# Patient Record
Sex: Male | Born: 2000 | Race: Black or African American | Hispanic: No | Marital: Single | Smoking: Never smoker
Health system: Southern US, Community
[De-identification: ages and names within clinical notes are randomized; demographics above are authoritative.]

## PROBLEM LIST (undated history)

## (undated) DIAGNOSIS — S43006A Unspecified dislocation of unspecified shoulder joint, initial encounter: Secondary | ICD-10-CM

## (undated) DIAGNOSIS — K5900 Constipation, unspecified: Secondary | ICD-10-CM

## (undated) DIAGNOSIS — K219 Gastro-esophageal reflux disease without esophagitis: Secondary | ICD-10-CM

## (undated) DIAGNOSIS — Z8489 Family history of other specified conditions: Secondary | ICD-10-CM

## (undated) DIAGNOSIS — J45990 Exercise induced bronchospasm: Secondary | ICD-10-CM

---
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
## 2022-09-29 ENCOUNTER — Ambulatory Visit
Admission: EM | Admit: 2022-09-29 | Discharge: 2022-09-29 | Disposition: A | Discharge status: Home / Self Care | Payer: Medicaid Other | Attending: Internal Medicine | Admitting: Internal Medicine

## 2022-09-29 DIAGNOSIS — L0201 Cutaneous abscess of face: Secondary | ICD-10-CM

## 2022-09-29 MED ORDER — DOXYCYCLINE HYCLATE 100 MG PO CAPS: 100.0000 mg | ORAL_CAPSULE | Freq: Two times a day (BID) | ORAL | 0 refills | Status: AC

## 2022-09-29 NOTE — ED Provider Notes (Signed)
[facility]    CSN: [id number] Arrival date & time: 09/29/22  1012      History   Chief Complaint Chief Complaint  Patient presents with   Abscess    HPI Phillip Morrison is a 21 y.o. male.   Patient presents with concerns of abscess to forehead that started yesterday.  Patient reports that this has occurred before but has never been this large.  He states that it typically resolves on its own with warm compresses.  He has been using warm compresses with no improvement.  Denies any fever, body aches, chills.  Denies any injury to the area.  Denies any purulent drainage.   Abscess   Past Medical History:  Diagnosis Date   Acid reflux    Constipation    Exercise-induced asthma    prn inhaler   Family history of adverse reaction to anesthesia    pt's mother has hx. of post-op N/V   Shoulder dislocation 01/2018   left    There are no problems to display for this patient.   Past Surgical History:  Procedure Laterality Date   SHOULDER ARTHROSCOPY WITH BANKART REPAIR Left 02/06/2018   Procedure: LEFT SHOULDER ARTHROSCOPY WITH LABRAL REPAIR;  Surgeon: Bjorn Pippin, MD;  Location: [facility];  Service: Orthopedics;  Laterality: Left;   SHOULDER ARTHROSCOPY WITH CAPSULORRHAPHY Left 02/06/2018   Procedure: SHOULDER ATHROSCOPY WITH CAPSULORRHAPHY AND GLENOHUMERAL LIGAMENT REPAIR;  Surgeon: Bjorn Pippin, MD;  Location: [facility];  Service: Orthopedics;  Laterality: Left;       Home Medications    Prior to Admission medications   Medication Sig Start Date End Date Taking? Authorizing Provider  doxycycline (VIBRAMYCIN) 100 MG capsule Take 1 capsule (100 mg total) by mouth 2 (two) times daily. 09/29/22  Yes Eddison Searls, Acie Fredrickson, FNP  albuterol (PROVENTIL HFA;VENTOLIN HFA) 108 (90 Base) MCG/ACT inhaler Inhale 1-2 puffs into the lungs every 6 (six) hours as needed for wheezing or shortness of breath. 01/22/16   Lorre Nick, MD  omeprazole  (PRILOSEC) 10 MG capsule Take 10 mg by mouth as needed.    [provider]  polyethylene glycol powder (MIRALAX) powder Use as directed per handout provided 10/11/14   Lowanda Foster, NP    Family History Family History  Problem Relation Age of Onset   Anesthesia problems Mother        post-op N/V   Hypertension Mother    Asthma Mother    Asthma Sister     Social History Social History   Tobacco Use   Smoking status: Never   Smokeless tobacco: Never  Vaping Use   Vaping Use: Never used  Substance Use Topics   Alcohol use: No   Drug use: No     Allergies   Lactose intolerance (gi)   Review of Systems Review of Systems Per HPI  Physical Exam Triage Vital Signs ED Triage Vitals [09/29/22 1035]  Enc Vitals Group     BP 114/70     Pulse Rate (!) 53     Resp 16     Temp 98.1 F (36.7 C)     Temp Source Oral     SpO2 97 %     Weight      Height      Head Circumference      Peak Flow      Pain Score 0     Pain Loc      Pain Edu?  Excl. in [place]?    No data found.  Updated Vital Signs BP 114/70 (BP Location: Left Arm)   Pulse (!) 53   Temp 98.1 F (36.7 C) (Oral)   Resp 16   SpO2 97%   Visual Acuity Right Eye Distance:   Left Eye Distance:   Bilateral Distance:    Right Eye Near:   Left Eye Near:    Bilateral Near:     Physical Exam Constitutional:      General: He is not in acute distress.    Appearance: Normal appearance. He is not toxic-appearing or diaphoretic.  HENT:     Head: Normocephalic and atraumatic.      Comments: Approximately 2.5 to 3 cm indurated abscess present between eyebrows.  No drainage noted.  There is an erythematous area present to right lower portion that may be consistent with pimple-like lesion. Eyes:     Extraocular Movements: Extraocular movements intact.     Conjunctiva/sclera: Conjunctivae normal.  Pulmonary:     Effort: Pulmonary effort is normal.  Neurological:     General: No focal deficit  present.     Mental Status: He is alert and oriented to person, place, and time. Mental status is at baseline.  Psychiatric:        Mood and Affect: Mood normal.        Behavior: Behavior normal.        Thought Content: Thought content normal.        Judgment: Judgment normal.      UC Treatments / Results  Labs (all labs ordered are listed, but only abnormal results are displayed) Labs Reviewed - No data to display  EKG   Radiology No results found.  Procedures Procedures (including critical care time)  Medications Ordered in UC Medications - No data to display  Initial Impression / Assessment and Plan / UC Course  I have reviewed the triage vital signs and the nursing notes.  Pertinent labs & imaging results that were available during my care of the patient were reviewed by me and considered in my medical decision making (see chart for details).     Patient has abscess-like lesion present to forehead.  It appears that it may have started out as a acne pimple that has progressed into an abscess.  Advised warm compresses and will treat with doxycycline antibiotic.  Advised patient to take this medication with food.  Patient was advised to follow-up at the emergency department if no improvement in the next 48 hours and voiced understanding.  Patient verbalized understanding and was agreeable with plan. Final Clinical Impressions(s) / UC Diagnoses   Final diagnoses:  Facial abscess     Discharge Instructions      You have an abscess of your face which is being treated with an antibiotic.  Please continue warm compresses.  If no improvement in the next 48 hours, please go to the emergency department.    ED Prescriptions     Medication Sig Dispense Auth. Provider   doxycycline (VIBRAMYCIN) 100 MG capsule Take 1 capsule (100 mg total) by mouth 2 (two) times daily. 20 capsule Teodora Medici, [place]      PDMP not reviewed this encounter.   Teodora Medici,  09/29/22  [phone]

## 2022-09-29 NOTE — Discharge Instructions (Signed)
You have an abscess of your face which is being treated with an antibiotic.  Please continue warm compresses.  If no improvement in the next 48 hours, please go to the emergency department.

## 2022-09-29 NOTE — ED Triage Notes (Signed)
Pt c/o possible abscess to forehead onset ~ yesterday. Associated dryness throughout face, and scalp.

## 2022-11-21 ENCOUNTER — Ambulatory Visit: Admit: 2022-11-21 | Discharge status: Absent without leave | Payer: Medicaid Other

## 2023-08-06 ENCOUNTER — Encounter: Payer: Self-pay | Admitting: Family Medicine

## 2023-08-06 ENCOUNTER — Ambulatory Visit (INDEPENDENT_AMBULATORY_CARE_PROVIDER_SITE_OTHER): Payer: Medicaid Other | Admitting: Family Medicine

## 2023-08-06 VITALS — BP 128/74 | HR 52 | Temp 98.2°F | Resp 16 | Ht 73.5 in | Wt 186.4 lb

## 2023-08-06 DIAGNOSIS — L700 Acne vulgaris: Secondary | ICD-10-CM | POA: Diagnosis not present

## 2023-08-06 DIAGNOSIS — Z7689 Persons encountering health services in other specified circumstances: Secondary | ICD-10-CM

## 2023-08-06 MED ORDER — MINOCYCLINE HCL 100 MG PO CAPS: 100.0000 mg | ORAL_CAPSULE | Freq: Every day | ORAL | 2 refills | Status: AC

## 2023-08-06 NOTE — Progress Notes (Unsigned)
Establish care

## 2023-08-07 ENCOUNTER — Encounter: Payer: Self-pay | Admitting: Family Medicine

## 2023-08-07 NOTE — Progress Notes (Signed)
New Patient Office Visit  Subjective    Patient ID: Phillip Morrison, male    DOB: 01/26/2001  Age: 22 y.o. MRN: [id number]  CC:  Chief Complaint  Patient presents with   Establish Care    HPI Phillip Morrison presents to establish care and for complaint of acne. He reports that he has been utilizing OTC benzoyl peroxide with minimal improvement.    Outpatient Encounter Medications as of 08/06/2023  Medication Sig   albuterol (PROVENTIL HFA;VENTOLIN HFA) 108 (90 Base) MCG/ACT inhaler Inhale 1-2 puffs into the lungs every 6 (six) hours as needed for wheezing or shortness of breath.   doxycycline (VIBRAMYCIN) 100 MG capsule Take 1 capsule (100 mg total) by mouth 2 (two) times daily.   minocycline (MINOCIN) 100 MG capsule Take 1 capsule (100 mg total) by mouth daily.   omeprazole (PRILOSEC) 10 MG capsule Take 10 mg by mouth as needed.   polyethylene glycol powder (MIRALAX) powder Use as directed per handout provided   No facility-administered encounter medications on file as of 08/06/2023.    Past Medical History:  Diagnosis Date   Acid reflux    Constipation    Exercise-induced asthma    prn inhaler   Family history of adverse reaction to anesthesia    pt's mother has hx. of post-op N/V   Shoulder dislocation 01/2018   left    Past Surgical History:  Procedure Laterality Date   SHOULDER ARTHROSCOPY WITH BANKART REPAIR Left 02/06/2018   Procedure: LEFT SHOULDER ARTHROSCOPY WITH LABRAL REPAIR;  Surgeon: Bjorn Pippin, MD;  Location: [facility];  Service: Orthopedics;  Laterality: Left;   SHOULDER ARTHROSCOPY WITH CAPSULORRHAPHY Left 02/06/2018   Procedure: SHOULDER ATHROSCOPY WITH CAPSULORRHAPHY AND GLENOHUMERAL LIGAMENT REPAIR;  Surgeon: Bjorn Pippin, MD;  Location: [facility];  Service: Orthopedics;  Laterality: Left;    Family History  Problem Relation Age of Onset   Anesthesia problems Mother        post-op N/V   Hypertension Mother    Asthma  Mother    Asthma Sister     Social History   Socioeconomic History   Marital status: Single    Spouse name: Not on file   Number of children: Not on file   Years of education: Not on file   Highest education level: Not on file  Occupational History   Not on file  Tobacco Use   Smoking status: Never   Smokeless tobacco: Never  Vaping Use   Vaping status: Never Used  Substance and Sexual Activity   Alcohol use: No   Drug use: No   Sexual activity: Not on file  Other Topics Concern   Not on file  Social History Narrative   Not on file   Social Determinants of Health   Financial Resource Strain: Low Risk  (08/06/2023)   Overall Financial Resource Strain (CARDIA)    Difficulty of Paying Living Expenses: Not hard at all  Food Insecurity: No Food Insecurity (08/06/2023)   Hunger Vital Sign    Worried About Running Out of Food in the Last Year: Never true    Ran Out of Food in the Last Year: Never true  Transportation Needs: No Transportation Needs (08/06/2023)   PRAPARE - Administrator, Civil Service (Medical): No    Lack of Transportation (Non-Medical): No  Physical Activity: Sufficiently Active (08/06/2023)   Exercise Vital Sign    Days of Exercise per Week: 7 days  Minutes of Exercise per Session: 30 min  Stress: No Stress Concern Present (08/06/2023)   [employer] of Occupational Health - Occupational Stress Questionnaire    Feeling of Stress : Not at all  Social Connections: Moderately Integrated (08/06/2023)   Social Connection and Isolation Panel [NHANES]    Frequency of Communication with Friends and Family: More than three times a week    Frequency of Social Gatherings with Friends and Family: More than three times a week    Attends Religious Services: More than 4 times per year    Active Member of [employer] or Organizations: Yes    Attends Banker Meetings: More than 4 times per year    Marital Status: Never married  Intimate Partner  Violence: Not At Risk (08/06/2023)   Humiliation, Afraid, Rape, and Kick questionnaire    Fear of Current or Ex-Partner: No    Emotionally Abused: No    Physically Abused: No    Sexually Abused: No    Review of Systems  All other systems reviewed and are negative.       Objective    BP 128/74 (BP Location: Right Arm, Patient Position: Sitting, Cuff Size: Large)   Pulse (!) 52   Temp 98.2 F (36.8 C) (Oral)   Resp 16   Ht 6' 1.5" (1.867 m)   Wt 186 lb 6.4 oz (84.6 kg)   SpO2 96%   BMI 24.26 kg/m   Physical Exam Vitals and nursing note reviewed.  Constitutional:      General: He is not in acute distress. Cardiovascular:     Rate and Rhythm: Normal rate and regular rhythm.  Pulmonary:     Effort: Pulmonary effort is normal.     Breath sounds: Normal breath sounds.  Skin:    Findings: Acne (with postinflammatory hyperpigmentation noted) present.  Neurological:     General: No focal deficit present.     Mental Status: He is alert and oriented to person, place, and time.         Assessment & Plan:   Acne vulgaris  Encounter to establish care  Other orders -     Minocycline HCl; Take 1 capsule (100 mg total) by mouth daily.  Dispense: 30 capsule; Refill: 2     Return in about 3 months (around 11/05/2023) for follow up.   Tommie Raymond, MD

## 2023-08-22 ENCOUNTER — Ambulatory Visit: Payer: Self-pay | Admitting: *Deleted

## 2023-08-22 NOTE — Telephone Encounter (Signed)
Tried to reach pt; left vm to call office back to schedule appt. Next appt available 10/22

## 2023-08-22 NOTE — Telephone Encounter (Signed)
Reason for Disposition  [1] MODERATE pain (e.g., interferes with normal activities) AND [2] pain comes and goes (cramps) AND [3] present > 24 hours  (Exception: Pain with Vomiting or Diarrhea - see that Guideline.)    C/o left lower abd pain  Answer Assessment - Initial Assessment Questions 1. LOCATION: "Where does it hurt?"      2 days ago I started hurting in lower abd on left.   No diarrhea or vomiting  2. RADIATION: "Does the pain shoot anywhere else?" (e.g., chest, back)     No 3. ONSET: "When did the pain begin?" (Minutes, hours or days ago)      2 days ago 4. SUDDEN: "Gradual or sudden onset?"     Not asked 5. PATTERN "Does the pain come and go, or is it constant?"    - If it comes and goes: "How long does it last?" "Do you have pain now?"     (Note: Comes and goes means the pain is intermittent. It goes away completely between bouts.)    - If constant: "Is it getting better, staying the same, or getting worse?"      (Note: Constant means the pain never goes away completely; most serious pain is constant and gets worse.)      He is on an antibiotic for his skin.   I was on it for a couple of days.   6. SEVERITY: "How bad is the pain?"  (e.g., Scale 1-10; mild, moderate, or severe)    - MILD (1-3): Doesn't interfere with normal activities, abdomen soft and not tender to touch.     - MODERATE (4-7): Interferes with normal activities or awakens from sleep, abdomen tender to touch.     - SEVERE (8-10): Excruciating pain, doubled over, unable to do any normal activities.       6/10 7. RECURRENT SYMPTOM: "Have you ever had this type of stomach pain before?" If Yes, ask: "When was the last time?" and "What happened that time?"      No 8. CAUSE: "What do you think is causing the stomach pain?"     I don't know 9. RELIEVING/AGGRAVATING FACTORS: "What makes it better or worse?" (e.g., antacids, bending or twisting motion, bowel movement)     nothing 10. OTHER SYMPTOMS: "Do you have any  other symptoms?" (e.g., back pain, diarrhea, fever, urination pain, vomiting)       I have nausea.   He feels tired.   He wants to sleep.  No coughing  Protocols used: Abdominal Pain - Male-A-AH

## 2023-08-22 NOTE — Telephone Encounter (Addendum)
  Chief Complaint: Left lower abd pain. Symptoms: Having nausea and left lower abd pain.   Started taking Minocycline 100 mg for acne on 08/06/2023 when he established care with Dr. Andrey Campanile. Frequency: For last 2 days. Pertinent Negatives: Patient denies diarrhea, vomiting or other symptoms.   No fever, runny nose, coughing, sore throat, no diarrhea or vomiting. Disposition: [] ED /[x] Urgent Care (no appt availability in office) / [] Appointment(In office/virtual)/ [x]  [facility]/ [] Home Care/ [] Refused Recommended Disposition /[] [facility]/ [x]  Follow-up with PCP Additional Notes: Mother Noralee Chars and pt on phone call.   There are no appts available with either provider at [facility] until Oct. 23, 2024 at the earliest with Ricky Stabs, NP.    They just established care on 08/06/2023 with Dr. Andrey Campanile.   I offered him a [facility] virtual visit and urgent care as options.   [facility] is in out of town today so did not offer that option.   I let them know I would send a message and see if he can be worked in.   They were agreeable to this plan.     I have added him to the wait list in case of a cancellation.    Message sent to [facility] at [facility].

## 2023-11-05 ENCOUNTER — Ambulatory Visit: Payer: Medicaid Other | Admitting: Family Medicine

## 2023-11-07 ENCOUNTER — Ambulatory Visit: Payer: Medicaid Other | Admitting: Family Medicine

## 2024-06-04 ENCOUNTER — Ambulatory Visit: Payer: Self-pay

## 2024-06-04 NOTE — Telephone Encounter (Signed)
  FYI Only or Action Required?: FYI only for provider.  Patient was last seen in primary care on 08/06/2023 by Tanda Bleacher, MD.  Called Nurse Triage reporting cyst.  Symptoms began a week ago.  Interventions attempted: Nothing.  Symptoms are: unchanged.  Triage Disposition: See Physician Within 24 Hours  Patient/caregiver understands and will follow disposition?: Yes  Called [facility], no appt availbale, gave information for [facility] and also for UC.      Copied from CRM [id number]. Topic: Clinical - Red Word Triage >> Jun 04, 2024  1:52 PM Jayma L wrote: Red Word that prompted transfer to Nurse Triage: patients mom is calling, Tenna, stated here is a cyst on his eye and one between his eyes. The one between his eye is getting worse and there is some swelling , its hard to touch and asking for a appt today if possible. This has been going on off and on. Started about a week ago , on the left side. Worried about one between eye Reason for Disposition  [1] Swelling is painful to touch AND [2] no fever  Answer Assessment - Initial Assessment Questions 1. APPEARANCE of SWELLING: What does it look like?     swelling 2. SIZE: How large is the swelling? (e.g., inches, cm; or compare to size of pinhead, tip of pen, eraser, coin, pea, grape, ping pong ball)      A quarter, dime 3. LOCATION: Where is the swelling located?     Above right eye and above bridge of nose 4. ONSET: When did the swelling start?     A week ago 5. COLOR: What color is it? Is there more than one color?     red 6. PAIN: Is there any pain? If Yes, ask: How bad is the pain? (Scale 1-10; or mild, moderate, severe)       Only to touch 7. ITCH: Does it itch? If Yes, ask: How bad is the itch?      itching 8. CAUSE: What do you think caused the swelling?     cyst 9 OTHER SYMPTOMS: Do you have any other symptoms? (e.g., fever)     no  Protocols used: Skin Lump or Localized Swelling-A-AH

## 2024-07-27 ENCOUNTER — Encounter: Payer: Self-pay | Admitting: *Deleted

## 2024-07-27 ENCOUNTER — Other Ambulatory Visit: Payer: Self-pay

## 2024-07-27 ENCOUNTER — Ambulatory Visit: Admission: EM | Admit: 2024-07-27 | Discharge: 2024-07-27 | Disposition: A | Discharge status: Home / Self Care

## 2024-07-27 DIAGNOSIS — J069 Acute upper respiratory infection, unspecified: Secondary | ICD-10-CM

## 2024-07-27 LAB — POC SOFIA SARS ANTIGEN FIA: SARS Coronavirus 2 Ag: NEGATIVE

## 2024-07-27 NOTE — ED Provider Notes (Signed)
 [facility]    CSN: [id number] Arrival date & time: 07/27/24  1720      History   Chief Complaint Chief Complaint  Patient presents with   Cough    HPI Asa Baudoin is a 23 y.o. male.   Patient here today for evaluation of cough, headache, congestion that started this morning.  He reports he did vomit after coughing.  He states he has been around a teammate who is also sick with similar symptoms. He has taken robitussin with some relief.  The history is provided by the patient.  Cough Associated symptoms: headaches   Associated symptoms: no chills, no ear pain, no eye discharge, no fever, no shortness of breath and no sore throat     Past Medical History:  Diagnosis Date   Acid reflux    Constipation    Exercise-induced asthma    prn inhaler   Family history of adverse reaction to anesthesia    pt's mother has hx. of post-op N/V   Shoulder dislocation 01/2018   left    There are no active problems to display for this patient.   Past Surgical History:  Procedure Laterality Date   SHOULDER ARTHROSCOPY WITH BANKART REPAIR Left 02/06/2018   Procedure: LEFT SHOULDER ARTHROSCOPY WITH LABRAL REPAIR;  Surgeon: Cristy Bonner DASEN, MD;  Location: [facility];  Service: Orthopedics;  Laterality: Left;   SHOULDER ARTHROSCOPY WITH CAPSULORRHAPHY Left 02/06/2018   Procedure: SHOULDER ATHROSCOPY WITH CAPSULORRHAPHY AND GLENOHUMERAL LIGAMENT REPAIR;  Surgeon: Cristy Bonner DASEN, MD;  [facility];  Service: Orthopedics;  Laterality: Left;       Home Medications    Prior to Admission medications   Medication Sig Start Date End Date Taking? Authorizing Provider  albuterol  (PROVENTIL  HFA;VENTOLIN  HFA) 108 (90 Base) MCG/ACT inhaler Inhale 1-2 puffs into the lungs every 6 (six) hours as needed for wheezing or shortness of breath. 01/22/16  Yes Dasie Faden, MD  meloxicam  (MOBIC ) 15 MG tablet Take 15 mg by mouth daily as needed. 05/26/24  Yes  [provider]  doxycycline  (VIBRAMYCIN ) 100 MG capsule Take 1 capsule (100 mg total) by mouth 2 (two) times daily. Patient not taking: Reported on 07/27/2024 09/29/22   Hazen Darryle BRAVO, FNP  minocycline  (MINOCIN ) 100 MG capsule Take 1 capsule (100 mg total) by mouth daily. Patient not taking: Reported on 07/27/2024 08/06/23   Tanda Bleacher, MD  omeprazole (PRILOSEC) 10 MG capsule Take 10 mg by mouth as needed. Patient not taking: Reported on 07/27/2024    [provider]  polyethylene glycol powder (MIRALAX ) powder Use as directed per handout provided Patient not taking: Reported on 07/27/2024 10/11/14   Eilleen Colander, NP  predniSONE (DELTASONE) 10 MG tablet 10 mg 6 day dose pack   6 day one, 5 pills day 2, 4 pills, day 3, 3 pills day 4, 2 pills day 5, 1 pill day 6 Patient not taking: Reported on 07/27/2024 05/26/24   [provider]    Family History Family History  Problem Relation Age of Onset   Anesthesia problems Mother        post-op N/V   Hypertension Mother    Asthma Mother    Asthma Sister     Social History Social History   Tobacco Use   Smoking status: Never   Smokeless tobacco: Never  Vaping Use   Vaping status: Never Used  Substance Use Topics   Alcohol use: No   Drug use: No  Allergies   Lactose intolerance (gi)   Review of Systems Review of Systems  Constitutional:  Negative for chills and fever.  HENT:  Positive for congestion. Negative for ear pain and sore throat.   Eyes:  Negative for discharge and redness.  Respiratory:  Positive for cough. Negative for shortness of breath.   Gastrointestinal:  Positive for vomiting. Negative for abdominal pain and nausea.  Neurological:  Positive for headaches.     Physical Exam Triage Vital Signs ED Triage Vitals [07/27/24 1728]  Encounter Vitals Group     BP      Girls Systolic BP Percentile      Girls Diastolic BP Percentile      Boys Systolic BP Percentile      Boys Diastolic BP  Percentile      Pulse      Resp      Temp      Temp src      SpO2      Weight      Height      Head Circumference      Peak Flow      Pain Score 3     Pain Loc      Pain Education      Exclude from Growth Chart    No data found.  Updated Vital Signs BP 128/76 (BP Location: Left Arm)   Pulse 62   Temp 98.2 F (36.8 C) (Oral)   Resp 18   SpO2 98%   Visual Acuity Right Eye Distance:   Left Eye Distance:   Bilateral Distance:    Right Eye Near:   Left Eye Near:    Bilateral Near:     Physical Exam Vitals and nursing note reviewed.  Constitutional:      General: He is not in acute distress.    Appearance: Normal appearance. He is not ill-appearing.  HENT:     Head: Normocephalic and atraumatic.     Nose: Congestion present.     Mouth/Throat:     Mouth: Mucous membranes are moist.     Pharynx: Oropharynx is clear. No oropharyngeal exudate or posterior oropharyngeal erythema.  Eyes:     Conjunctiva/sclera: Conjunctivae normal.  Cardiovascular:     Rate and Rhythm: Normal rate and regular rhythm.     Heart sounds: Normal heart sounds. No murmur heard. Pulmonary:     Effort: Pulmonary effort is normal. No respiratory distress.     Breath sounds: Normal breath sounds. No wheezing, rhonchi or rales.  Skin:    General: Skin is warm and dry.  Neurological:     Mental Status: He is alert.  Psychiatric:        Mood and Affect: Mood normal.        Thought Content: Thought content normal.      UC Treatments / Results  Labs (all labs ordered are listed, but only abnormal results are displayed) Labs Reviewed  POC SOFIA SARS ANTIGEN FIA - Normal    EKG   Radiology No results found.  Procedures Procedures (including critical care time)  Medications Ordered in UC Medications - No data to display  Initial Impression / Assessment and Plan / UC Course  I have reviewed the triage vital signs and the nursing notes.  Pertinent labs & imaging results that were  available during my care of the patient were reviewed by me and considered in my medical decision making (see chart for details).    Covid screening negative. Suspect  other viral illness and recommended symptomatic treatment, increased fluids and rest with follow up if no gradual improvement or with any further concerns.   Final Clinical Impressions(s) / UC Diagnoses   Final diagnoses:  Acute upper respiratory infection   Discharge Instructions   None    ED Prescriptions   None    PDMP not reviewed this encounter.   Billy Asberry FALCON, PA-C 07/27/24 1810

## 2024-07-27 NOTE — ED Triage Notes (Signed)
 Pt reports he woke up this morning with cough (emesis after coughing), headache, congestion. He has been around a teammate who is sick

## 2024-07-30 ENCOUNTER — Other Ambulatory Visit: Payer: Self-pay

## 2024-07-30 ENCOUNTER — Encounter (HOSPITAL_COMMUNITY): Payer: Self-pay

## 2024-07-30 ENCOUNTER — Encounter: Payer: Self-pay | Admitting: Emergency Medicine

## 2024-07-30 ENCOUNTER — Ambulatory Visit: Admission: EM | Admit: 2024-07-30 | Discharge: 2024-07-30 | Disposition: A | Discharge status: Home / Self Care

## 2024-07-30 ENCOUNTER — Emergency Department (HOSPITAL_COMMUNITY)
Admission: EM | Admit: 2024-07-30 | Discharge: 2024-07-30 | Disposition: A | Discharge status: Home / Self Care | Attending: Emergency Medicine | Admitting: Emergency Medicine

## 2024-07-30 ENCOUNTER — Emergency Department (HOSPITAL_COMMUNITY)

## 2024-07-30 DIAGNOSIS — R0789 Other chest pain: Secondary | ICD-10-CM | POA: Diagnosis present

## 2024-07-30 DIAGNOSIS — J45909 Unspecified asthma, uncomplicated: Secondary | ICD-10-CM | POA: Insufficient documentation

## 2024-07-30 DIAGNOSIS — R079 Chest pain, unspecified: Secondary | ICD-10-CM | POA: Diagnosis not present

## 2024-07-30 DIAGNOSIS — A084 Viral intestinal infection, unspecified: Secondary | ICD-10-CM | POA: Diagnosis not present

## 2024-07-30 DIAGNOSIS — R059 Cough, unspecified: Secondary | ICD-10-CM | POA: Insufficient documentation

## 2024-07-30 DIAGNOSIS — K2970 Gastritis, unspecified, without bleeding: Secondary | ICD-10-CM

## 2024-07-30 DIAGNOSIS — R112 Nausea with vomiting, unspecified: Secondary | ICD-10-CM

## 2024-07-30 LAB — CBC WITH DIFFERENTIAL/PLATELET
Abs Immature Granulocytes: 0.02 K/uL (ref 0.00–0.07)
Basophils Absolute: 0 K/uL (ref 0.0–0.1)
Basophils Relative: 0 %
Eosinophils Absolute: 0 K/uL (ref 0.0–0.5)
Eosinophils Relative: 0 %
HCT: 46.4 % (ref 39.0–52.0)
Hemoglobin: 14.4 g/dL (ref 13.0–17.0)
Immature Granulocytes: 0 %
Lymphocytes Relative: 31 %
Lymphs Abs: 2.2 K/uL (ref 0.7–4.0)
MCH: 27.1 pg (ref 26.0–34.0)
MCHC: 31 g/dL (ref 30.0–36.0)
MCV: 87.4 fL (ref 80.0–100.0)
Monocytes Absolute: 0.5 K/uL (ref 0.1–1.0)
Monocytes Relative: 7 %
Neutro Abs: 4.3 K/uL (ref 1.7–7.7)
Neutrophils Relative %: 62 %
Platelets: 227 K/uL (ref 150–400)
RBC: 5.31 MIL/uL (ref 4.22–5.81)
RDW: 13.5 % (ref 11.5–15.5)
WBC: 7 K/uL (ref 4.0–10.5)
nRBC: 0 % (ref 0.0–0.2)

## 2024-07-30 LAB — COMPREHENSIVE METABOLIC PANEL WITH GFR
ALT: 14 U/L (ref 0–44)
AST: 18 U/L (ref 15–41)
Albumin: 4.3 g/dL (ref 3.5–5.0)
Alkaline Phosphatase: 83 U/L (ref 38–126)
Anion gap: 12 (ref 5–15)
BUN: 11 mg/dL (ref 6–20)
CO2: 23 mmol/L (ref 22–32)
Calcium: 9.8 mg/dL (ref 8.9–10.3)
Chloride: 104 mmol/L (ref 98–111)
Creatinine, Ser: 1.17 mg/dL (ref 0.61–1.24)
GFR, Estimated: >60 mL/min (ref >60)
Glucose, Bld: 98 mg/dL (ref 70–99)
Potassium: 4.1 mmol/L (ref 3.5–5.1)
Sodium: 139 mmol/L (ref 135–145)
Total Bilirubin: 0.4 mg/dL (ref 0.0–1.2)
Total Protein: 7.2 g/dL (ref 6.5–8.1)

## 2024-07-30 LAB — LIPASE, BLOOD: Lipase: 33 U/L (ref 11–51)

## 2024-07-30 MED ORDER — ONDANSETRON 4 MG PO TBDP
4.0000 mg | ORAL_TABLET | Freq: Once | ORAL | Status: AC
  Administered 2024-07-30: 4 mg via ORAL
  Filled 2024-07-30: qty 1

## 2024-07-30 MED ORDER — PROMETHAZINE HCL 25 MG PO TABS: 25.0000 mg | ORAL_TABLET | Freq: Four times a day (QID) | ORAL | 0 refills | Status: AC | PRN

## 2024-07-30 MED ORDER — KETOROLAC TROMETHAMINE 30 MG/ML IJ SOLN
30.0000 mg | Freq: Once | INTRAMUSCULAR | Status: AC
  Administered 2024-07-30: 30 mg via INTRAMUSCULAR
  Filled 2024-07-30: qty 1

## 2024-07-30 NOTE — ED Provider Notes (Signed)
 [facility] AT [facility] Provider Note   CSN: [id number] Arrival date & time: 07/30/24  1659     Patient presents with: Chest Pain   Phillip Morrison is a 23 y.o. male.   HPI     This is a 23 year old male who presents with nausea, vomiting, chest pain.  Patient reports 3 to 4 days of nausea and vomiting.  He also had some diarrhea to begin with.  No known sick contacts or fevers.  Since that time he has developed some left-sided chest discomfort.  It is mostly with vomiting.  He was seen and evaluated urgent care on 9/1 with COVID testing negative.  Suspected viral illness.  He saw urgent care again today given persistence of symptoms, they recommended he be evaluated.  He has not had any shortness of breath.  Has had an occasional cough.  Took ibuprofen  with some relief.  Prior to Admission medications   Medication Sig Start Date End Date Taking? Authorizing Provider  promethazine  (PHENERGAN ) 25 MG tablet Take 1 tablet (25 mg total) by mouth every 6 (six) hours as needed for nausea or vomiting. 07/30/24  Yes Gizelle Whetsel, Charmaine FALCON, MD  albuterol  (PROVENTIL  HFA;VENTOLIN  HFA) 108 (90 Base) MCG/ACT inhaler Inhale 1-2 puffs into the lungs every 6 (six) hours as needed for wheezing or shortness of breath. 01/22/16   Dasie Faden, MD  doxycycline  (VIBRAMYCIN ) 100 MG capsule Take 1 capsule (100 mg total) by mouth 2 (two) times daily. Patient not taking: Reported on 07/27/2024 09/29/22   Hazen Darryle BRAVO, FNP  ibuprofen  (ADVIL ) 100 MG/5ML suspension Take 5 mg/kg by mouth. Patient not taking: Reported on 07/30/2024 01/31/12   [provider]  meloxicam  (MOBIC ) 15 MG tablet Take 15 mg by mouth daily as needed. 05/26/24   [provider]  minocycline  (MINOCIN ) 100 MG capsule Take 1 capsule (100 mg total) by mouth daily. Patient not taking: Reported on 07/27/2024 08/06/23   Tanda Bleacher, MD  omeprazole (PRILOSEC) 10 MG capsule Take 10 mg by mouth as needed. Patient  not taking: Reported on 07/27/2024    [provider]  ondansetron  (ZOFRAN -ODT) 8 MG disintegrating tablet Take 8 mg by mouth 3 (three) times daily. 07/28/24   [provider]  polyethylene glycol powder (MIRALAX ) powder Use as directed per handout provided Patient not taking: Reported on 07/27/2024 10/11/14   Eilleen Colander, NP  predniSONE (DELTASONE) 10 MG tablet 10 mg 6 day dose pack   6 day one, 5 pills day 2, 4 pills, day 3, 3 pills day 4, 2 pills day 5, 1 pill day 6 Patient not taking: Reported on 07/27/2024 05/26/24   [provider]    Allergies: Lactose intolerance (gi) and Lactulose    Review of Systems  Constitutional:  Negative for fever.  Respiratory:  Positive for cough. Negative for shortness of breath.   Cardiovascular:  Positive for chest pain.  Gastrointestinal:  Positive for nausea and vomiting. Negative for abdominal pain.  All other systems reviewed and are negative.   Updated Vital Signs BP (!) 125/59   Pulse (!) 48   Temp 98.3 F (36.8 C) (Oral)   Resp 17   SpO2 100%   Physical Exam Vitals and nursing note reviewed.  Constitutional:      Appearance: He is well-developed. He is not ill-appearing.  HENT:     Head: Normocephalic and atraumatic.  Eyes:     Pupils: Pupils are equal, round, and reactive to light.  Cardiovascular:  Rate and Rhythm: Normal rate and regular rhythm.     Heart sounds: Normal heart sounds. No murmur heard. Pulmonary:     Effort: Pulmonary effort is normal. No respiratory distress.     Breath sounds: Normal breath sounds. No wheezing.  Chest:     Chest wall: Tenderness present.     Comments: Left chest wall tenderness to palpation without overlying skin changes or crepitus Abdominal:     General: Bowel sounds are normal.     Palpations: Abdomen is soft.     Tenderness: There is abdominal tenderness. There is no rebound.     Comments: Epigastric tenderness to palpation  Musculoskeletal:     Cervical back:  Neck supple.  Lymphadenopathy:     Cervical: No cervical adenopathy.  Skin:    General: Skin is warm and dry.  Neurological:     Mental Status: He is alert and oriented to person, place, and time.  Psychiatric:        Mood and Affect: Mood normal.     (all labs ordered are listed, but only abnormal results are displayed) Labs Reviewed  CBC WITH DIFFERENTIAL/PLATELET  COMPREHENSIVE METABOLIC PANEL WITH GFR  LIPASE, BLOOD    EKG: EKG Interpretation Date/Time:  Thursday July 30 2024 17:08:27 EDT Ventricular Rate:  54 PR Interval:  134 QRS Duration:  84 QT Interval:  389 QTC Calculation: 369 R Axis:   34  Text Interpretation: Sinus rhythm RSR' in V1 or V2, probably normal variant Nonspecific T abnormalities, inferior leads Confirmed by Bari Pfeiffer ([id number]) on 07/30/2024 5:19:53 PM  Radiology: [facility] Chest 2 View Result Date: 07/30/2024 CLINICAL DATA:  Chest pain. EXAM: CHEST - 2 VIEW COMPARISON:  01/22/2016. FINDINGS: The heart size and mediastinal contours are within normal limits. No focal consolidation, pleural effusion, or pneumothorax. The visualized skeletal structures are unremarkable. IMPRESSION: No acute cardiopulmonary findings. Electronically Signed   By: Harrietta Sherry M.D.   On: 07/30/2024 18:16     Procedures   Medications Ordered in the ED  ondansetron  (ZOFRAN -ODT) disintegrating tablet 4 mg (4 mg Oral Given 07/30/24 1804)  ketorolac  (TORADOL ) 30 MG/ML injection 30 mg (30 mg Intramuscular Given 07/30/24 1805)                                    Medical Decision Making Amount and/or Complexity of Data Reviewed Labs: ordered. Radiology: ordered.  Risk Prescription drug management.   This patient presents to the ED for concern of chest pain, vomiting, this involves an extensive number of treatment options, and is a complaint that carries with it a high risk of complications and morbidity.  I considered the following differential and admission for this  acute, potentially life threatening condition.  The differential diagnosis includes viral illness, postviral gastritis, pneumonia, pneumothorax, chest wall pain  MDM:    This is a 23 year old male who presents with persistent nausea and vomiting.  Now has some chest pain.  He is nontoxic and vital signs are reassuring.  He has reproducible chest wall tenderness.  EKG shows no evidence of acute ischemia or arrhythmia.  Chest x-ray without pneumothorax or pneumonia.  May be related to muscle strain from vomiting.  Basic lab work obtained.  No leukocytosis.  No metabolic derangements.  LFTs and lipase are normal.  Suspect viral etiology.  Patient improved with IM Toradol  and Zofran .  Able to p.o. challenge.  States he has Zofran  at  home but it does not work well.  Will discharge with Phenergan .  May have some postviral ongoing gastritis causing nausea and vomiting.  Recommend clear liquid diet.  (Labs, imaging, consults)  Labs: I Ordered, and personally interpreted labs.  The pertinent results include: CBC, CMP, lipase  Imaging Studies ordered: I ordered imaging studies including chest x-ray I independently visualized and interpreted imaging. I agree with the radiologist interpretation  Additional history obtained from family at bedside.  External records from outside source obtained and reviewed including prior evaluations  Cardiac Monitoring: The patient was maintained on a cardiac monitor.  If on the cardiac monitor, I personally viewed and interpreted the cardiac monitored which showed an underlying rhythm of: Sinus  Reevaluation: After the interventions noted above, I reevaluated the patient and found that they have :improved  Social Determinants of Health:  lives independently  Disposition: Discharge  Co morbidities that complicate the patient evaluation  Past Medical History:  Diagnosis Date   Acid reflux    Constipation    Exercise-induced asthma    prn inhaler   Family  history of adverse reaction to anesthesia    pt's mother has hx. of post-op N/V   Shoulder dislocation 01/2018   left     Medicines Meds ordered this encounter  Medications   ondansetron  (ZOFRAN -ODT) disintegrating tablet 4 mg   ketorolac  (TORADOL ) 30 MG/ML injection 30 mg   promethazine  (PHENERGAN ) 25 MG tablet    Sig: Take 1 tablet (25 mg total) by mouth every 6 (six) hours as needed for nausea or vomiting.    Dispense:  30 tablet    Refill:  0    I have reviewed the patients home medicines and have made adjustments as needed  Problem List / ED Course: Problem List Items Addressed This Visit   None Visit Diagnoses       Atypical chest pain    -  Primary     Nausea and vomiting, unspecified vomiting type         Viral gastritis                    Final diagnoses:  Atypical chest pain  Nausea and vomiting, unspecified vomiting type  Viral gastritis    ED Discharge Orders          Ordered    promethazine  (PHENERGAN ) 25 MG tablet  Every 6 hours PRN        07/30/24 2027               Bari Charmaine FALCON, MD 07/30/24 2031

## 2024-07-30 NOTE — ED Provider Notes (Signed)
 [facility]    CSN: [id number] Arrival date & time: 07/30/24  1555      History   Chief Complaint Chief Complaint  Patient presents with   Chest Pain   Emesis    HPI Phillip Morrison is a 23 y.o. male.   Patient here today for evaluation of left-sided sharp chest pain.  He reports that this started after he had a 4-day illness with vomiting after he ate anything.  He denies any shortness of breath or palpitations.  He states chest pain does seem to be worse after vomiting.  He has not had any cough or congestion.  He is currently nauseated.  He has taken an antiemetic without improvement.  He does not report fever.  The history is provided by the patient.  Chest Pain Associated symptoms: nausea and vomiting   Associated symptoms: no fever and no shortness of breath   Emesis Associated symptoms: no fever     Past Medical History:  Diagnosis Date   Acid reflux    Constipation    Exercise-induced asthma    prn inhaler   Family history of adverse reaction to anesthesia    pt's mother has hx. of post-op N/V   Shoulder dislocation 01/2018   left    Patient Active Problem List   Diagnosis Date Noted   Slow transit constipation 08/22/2015   Lactose intolerance 04/23/2012   Abdominal pain 01/31/2012    Past Surgical History:  Procedure Laterality Date   SHOULDER ARTHROSCOPY WITH BANKART REPAIR Left 02/06/2018   Procedure: LEFT SHOULDER ARTHROSCOPY WITH LABRAL REPAIR;  Surgeon: Cristy Bonner DASEN, MD;  Location: [facility];  Service: Orthopedics;  Laterality: Left;   SHOULDER ARTHROSCOPY WITH CAPSULORRHAPHY Left 02/06/2018   Procedure: SHOULDER ATHROSCOPY WITH CAPSULORRHAPHY AND GLENOHUMERAL LIGAMENT REPAIR;  Surgeon: Cristy Bonner DASEN, MD;  Location: [facility];  Service: Orthopedics;  Laterality: Left;       Home Medications    Prior to Admission medications   Medication Sig Start Date End Date Taking? Authorizing Provider  meloxicam   (MOBIC ) 15 MG tablet Take 15 mg by mouth daily as needed. 05/26/24  Yes [provider]  ondansetron  (ZOFRAN -ODT) 8 MG disintegrating tablet Take 8 mg by mouth 3 (three) times daily. 07/28/24  Yes [provider]  albuterol  (PROVENTIL  HFA;VENTOLIN  HFA) 108 (90 Base) MCG/ACT inhaler Inhale 1-2 puffs into the lungs every 6 (six) hours as needed for wheezing or shortness of breath. 01/22/16   Dasie Faden, MD  doxycycline  (VIBRAMYCIN ) 100 MG capsule Take 1 capsule (100 mg total) by mouth 2 (two) times daily. Patient not taking: Reported on 07/27/2024 09/29/22   Hazen Darryle BRAVO, FNP  ibuprofen  (ADVIL ) 100 MG/5ML suspension Take 5 mg/kg by mouth. Patient not taking: Reported on 07/30/2024 01/31/12   [provider]  minocycline  (MINOCIN ) 100 MG capsule Take 1 capsule (100 mg total) by mouth daily. Patient not taking: Reported on 07/27/2024 08/06/23   Tanda Bleacher, MD  omeprazole (PRILOSEC) 10 MG capsule Take 10 mg by mouth as needed. Patient not taking: Reported on 07/27/2024    [provider]  polyethylene glycol powder (MIRALAX ) powder Use as directed per handout provided Patient not taking: Reported on 07/27/2024 10/11/14   Eilleen Colander, NP  predniSONE (DELTASONE) 10 MG tablet 10 mg 6 day dose pack   6 day one, 5 pills day 2, 4 pills, day 3, 3 pills day 4, 2 pills day 5, 1 pill day 6 Patient not  taking: Reported on 07/27/2024 05/26/24   [provider]    Family History Family History  Problem Relation Age of Onset   Anesthesia problems Mother        post-op N/V   Hypertension Mother    Asthma Mother    Asthma Sister     Social History Social History   Tobacco Use   Smoking status: Never    Passive exposure: Never   Smokeless tobacco: Never  Vaping Use   Vaping status: Never Used  Substance Use Topics   Alcohol use: No   Drug use: No     Allergies   Lactose intolerance (gi) and Lactulose   Review of Systems Review of Systems  Constitutional:   Negative for fever.  Eyes:  Negative for discharge and redness.  Respiratory:  Negative for shortness of breath.   Cardiovascular:  Positive for chest pain.  Gastrointestinal:  Positive for nausea and vomiting.     Physical Exam Triage Vital Signs ED Triage Vitals  Encounter Vitals Group     BP 07/30/24 1609 116/74     Girls Systolic BP Percentile --      Girls Diastolic BP Percentile --      Boys Systolic BP Percentile --      Boys Diastolic BP Percentile --      Pulse Rate 07/30/24 1609 61     Resp 07/30/24 1609 16     Temp 07/30/24 1609 98.7 F (37.1 C)     Temp Source 07/30/24 1609 Oral     SpO2 07/30/24 1609 95 %     Weight --      Height --      Head Circumference --      Peak Flow --      Pain Score 07/30/24 1610 3     Pain Loc --      Pain Education --      Exclude from Growth Chart --    No data found.  Updated Vital Signs BP 116/74 (BP Location: Left Arm)   Pulse 61   Temp 98.7 F (37.1 C) (Oral)   Resp 16   SpO2 95%   Visual Acuity Right Eye Distance:   Left Eye Distance:   Bilateral Distance:    Right Eye Near:   Left Eye Near:    Bilateral Near:     Physical Exam Vitals and nursing note reviewed.  Constitutional:      Appearance: He is well-developed.     Comments: Appears to not feel well  HENT:     Head: Normocephalic and atraumatic.     Nose: Nose normal. No congestion.  Eyes:     Conjunctiva/sclera: Conjunctivae normal.  Cardiovascular:     Rate and Rhythm: Normal rate and regular rhythm.  Pulmonary:     Effort: Pulmonary effort is normal. No respiratory distress.     Breath sounds: No wheezing, rhonchi or rales.  Neurological:     Mental Status: He is alert.      UC Treatments / Results  Labs (all labs ordered are listed, but only abnormal results are displayed) Labs Reviewed - No data to display  EKG   Radiology No results found.  Procedures Procedures (including critical care time)  Medications Ordered in  UC Medications - No data to display  Initial Impression / Assessment and Plan / UC Course  I have reviewed the triage vital signs and the nursing notes.  Pertinent labs & imaging results that were available  during my care of the patient were reviewed by me and considered in my medical decision making (see chart for details).    Recommended further evaluation in the emergency room for stat labs and further workup given duration of vomiting and chest pain.  Patient is agreeable to same and grandmother will transport via POV.  Final Clinical Impressions(s) / UC Diagnoses   Final diagnoses:  Chest pain, unspecified type   Discharge Instructions   None    ED Prescriptions   None    PDMP not reviewed this encounter.   Billy Asberry FALCON, PA-C 07/30/24 1725

## 2024-07-30 NOTE — ED Triage Notes (Signed)
 Pt states he is having chest pain that started on Tuesday, he states the pain goes into his left arm. He did have a cough and some vomiting last week. Pt states he has pain when he moves his left arm and takes a deep breath.

## 2024-07-30 NOTE — Discharge Instructions (Addendum)
 You were seen today for chest pain.  This is likely muscular in nature.  May be related to recent vomiting.  Maintain a clear liquid diet.  Take nausea medication for ongoing vomiting.  Your labs are reassuring.  This is most likely viral in nature.

## 2024-07-30 NOTE — ED Notes (Signed)
 Patient is being discharged from the Urgent Care and sent to the Emergency Department via POV . Per Billy, PA-C, patient is in need of higher level of care due to need for further evaluation of L-sided chest pain. Patient is aware and verbalizes understanding of plan of care.  Vitals:   07/30/24 1609  BP: 116/74  Pulse: 61  Resp: 16  Temp: 98.7 F (37.1 C)  SpO2: 95%

## 2024-07-30 NOTE — ED Triage Notes (Signed)
 Pt reports continued vomiting x4 days. Notes he vomits right after any meals or fluids. Feels weak. Started have intermittent L-sided sharp chest pain after vomiting episodes x4 days. Denies SOB or palpitations. Seen at [facility] 9/1 with emesis, cough, and congestion. Denies cough and congestion now. Is currently nauseous. States he has taken antiemetic with no relief.

## 2024-10-19 ENCOUNTER — Other Ambulatory Visit: Payer: Self-pay

## 2024-10-19 ENCOUNTER — Ambulatory Visit: Attending: Family Medicine

## 2024-10-19 DIAGNOSIS — M25552 Pain in left hip: Secondary | ICD-10-CM | POA: Diagnosis present

## 2024-10-19 DIAGNOSIS — S76302D Unspecified injury of muscle, fascia and tendon of the posterior muscle group at thigh level, left thigh, subsequent encounter: Secondary | ICD-10-CM | POA: Diagnosis present

## 2024-10-19 NOTE — Therapy (Signed)
 OUTPATIENT PHYSICAL THERAPY LOWER EXTREMITY EVALUATION   Patient Name: Phillip Morrison MRN: [id number] DOB:02/12/2001, 23 y.o., male Today's Date: 10/19/2024  END OF SESSION:  PT End of Session - 10/19/24 0902     Visit Number 1    Number of Visits 16    Date for Recertification  12/14/24    Authorization Type [employer]    Authorization Time Period none until completed 27th visit    PT Start Time 0915    PT Stop Time 1000    PT Time Calculation (min) 45 min    Activity Tolerance Patient tolerated treatment well    Behavior During Therapy [facility] for tasks assessed/performed          Past Medical History:  Diagnosis Date   Acid reflux    Constipation    Exercise-induced asthma    prn inhaler   Family history of adverse reaction to anesthesia    pt's mother has hx. of post-op N/V   Shoulder dislocation 01/2018   left   Past Surgical History:  Procedure Laterality Date   SHOULDER ARTHROSCOPY WITH BANKART REPAIR Left 02/06/2018   Procedure: LEFT SHOULDER ARTHROSCOPY WITH LABRAL REPAIR;  Surgeon: Cristy Bonner DASEN, MD;  Location: [facility];  Service: Orthopedics;  Laterality: Left;   SHOULDER ARTHROSCOPY WITH CAPSULORRHAPHY Left 02/06/2018   Procedure: SHOULDER ATHROSCOPY WITH CAPSULORRHAPHY AND GLENOHUMERAL LIGAMENT REPAIR;  Surgeon: Cristy Bonner DASEN, MD;  Location: [facility];  Service: Orthopedics;  Laterality: Left;   Patient Active Problem List   Diagnosis Date Noted   Slow transit constipation 08/22/2015   Lactose intolerance 04/23/2012   Abdominal pain 01/31/2012    PCP: Tanda Bleacher, MD  REFERRING PROVIDER: Curtis Hadassah DASEN, MD  REFERRING DIAG: [phone] (ICD-10-CM) - Pain in left hip   THERAPY DIAG:  Pain in left hip  Rationale for Evaluation and Treatment: Rehabilitation  ONSET DATE: 09/16/2024  SUBJECTIVE:   SUBJECTIVE STATEMENT: Pt reports he strained his L hamstring while cutting in football practice on 09/16/2024. He  notes hearing a pop after pushing off the L leg. Pt fell and had trouble getting up. He went to the doctor after who performed an X-ray and MRI which were negative for a tear and fracture. Pt reports the MRI showed swelling around the upper portion of the hamstring. Notes trouble walking for a few days. He has been performing some exercises given to him by the AT but has not practiced. Pt did try to squat just the bar last week but there was too much pain. He was squatting 345lb for his 1RM. Pt expresses improvement in symptoms since onset but continues to have pain when standing, marching, going up stairs, running, and squatting.    PERTINENT HISTORY: L ankle fracture, previous L hamstring strains, low back pain, previous MCL injury (unsure on side) PAIN:  Are you having pain? Yes: NPRS scale: 3/10;  Pain location: proximal L hamstring Pain description: ache Aggravating factors: marching, squatting, stairs (going up), standing greater than 1 hour Relieving factors: rest  PRECAUTIONS: None  RED FLAGS: None   WEIGHT BEARING RESTRICTIONS: No  FALLS:  Has patient fallen in last 6 months? No  LIVING ENVIRONMENT: Lives with: lives with their family Lives in: House/apartment Stairs: Yes: Internal: 10 steps; did not ask Has following equipment at home: None  OCCUPATION: student  PLOF: Independent  PATIENT GOALS: return to sport   NEXT MD VISIT: tbd  OBJECTIVE:  Note: Objective measures were completed at Evaluation unless  otherwise noted.  DIAGNOSTIC FINDINGS:  See subjective   PATIENT SURVEYS:  LEFS  Extreme difficulty/unable (0), Quite a bit of difficulty (1), Moderate difficulty (2), Little difficulty (3), No difficulty (4) Survey date:  10/19/2024  Any of your usual work, housework or school activities 3  2. Usual hobbies, recreational or sporting activities 0  3. Getting into/out of the bath 3  4. Walking between rooms 4  5. Putting on socks/shoes 4  6. Squatting  1   7. Lifting an object, like a bag of groceries from the floor 1  8. Performing light activities around your home 4  9. Performing heavy activities around your home 2  10. Getting into/out of a car 4  11. Walking 2 blocks 2  12. Walking 1 mile 1  13. Going up/down 10 stairs (1 flight) 3  14. Standing for 1 hour 2  15.  sitting for 1 hour 4  16. Running on even ground 0  17. Running on uneven ground 0  18. Making sharp turns while running fast 0  19. Hopping  2  20. Rolling over in bed 4  Score total:  44/80     COGNITION: Overall cognitive status: Within functional limits for tasks assessed     SENSATION: WFL  MUSCLE LENGTH: Hamstrings: deferred Thomas test: deferred  POSTURE: anterior pelvic tilt  PALPATION: Tenderness over proximal hamstring on L   LOWER EXTREMITY ROM:  Active ROM Right eval Left eval  Hip flexion 110 90*   Hip extension 10 5*  Hip abduction 40 25*  Hip adduction    Hip internal rotation WNL WNL*  Hip external rotation WNL WNL  Knee flexion    Knee extension 0 0  Ankle dorsiflexion 5 3  Ankle plantarflexion 50 40  Ankle inversion WNL WNL  Ankle eversion WNL WNL*   (Blank rows = not tested)(* = pain)   LOWER EXTREMITY MMT:  MMT Right eval Left eval  Hip flexion    Hip extension 5/5 3+/5*  Hip abduction 5/5 3+/5*  Hip adduction    Hip internal rotation    Hip external rotation    Knee flexion 5/5 4/5*  Knee extension    Ankle dorsiflexion 5/5 3+/5  Ankle plantarflexion    Ankle inversion 5/5 5/5  Ankle eversion 5/5 5/5   (Blank rows = not tested)(* = pain)   FUNCTIONAL TESTS:  SLS - able on R; mod sway on L  Squat bodyweight - ankle pronation, knee valgus, hip IR, cannot get below 90; pain; all on L side Squat with 45lb bar - ankle pronation, knee valgus, hip IR, cannot get below 90; pain; all on L side                                                                                                                                 TREATMENT DATE:  [facility]:  DATE: 10/14/2024    Therapeutic Exercise:  Squat bodyweight x 3 - form check + education  Squat with 45lb bar x 3 - form check + education   PATIENT EDUCATION:  Education details: POC, HEP, diagnosis, prognosis Person educated: Patient and Parent Education method: Explanation, Demonstration, Tactile cues, Verbal cues, and Handouts Education comprehension: verbalized understanding, returned demonstration, verbal cues required, tactile cues required, and needs further education  HOME EXERCISE PROGRAM: Access Code: [email or web address] URL: [email or web address] Date: 10/19/2024 Prepared by: Marijo Berber  Exercises - Ankle Dorsiflexion with Resistance  - 1 x daily - 7 x weekly - 3 sets - 10 reps - Ankle Inversion with Resistance  - 1 x daily - 7 x weekly - 3 sets - 10 reps - Ankle Eversion with Resistance  - 1 x daily - 7 x weekly - 3 sets - 10 reps - Side Stepping with Resistance at Feet  - 1 x daily - 7 x weekly - 3 sets - 10 reps - Side Plank with Clam and Resistance  - 1 x daily - 7 x weekly - 3 sets - 10 reps - Seated Hamstring Curl with Anchored Resistance  - 1 x daily - 7 x weekly - 3 sets - 10 reps  ASSESSMENT:  CLINICAL IMPRESSION: Patient is a 23 y.o. male who was seen today for physical therapy evaluation and treatment for L proximal hamstring strain. Pt presents with pain during hip extension, hip ABD, knee flexion, and hip IR. He is weak throughout the hip/knee/ankle with functional weakness seen in squatting and SLS. The pt has previous injuries on the L LE that is impacting his hip mobility and strength. The pt will benefit from skilled physical therapy to return decrease pain and increase function.    OBJECTIVE IMPAIRMENTS: decreased activity tolerance, decreased balance, decreased coordination, decreased mobility, decreased ROM, decreased strength, hypomobility, impaired  flexibility, improper body mechanics, and pain.   ACTIVITY LIMITATIONS: lifting, bending, standing, squatting, and stairs  PARTICIPATION LIMITATIONS: community activity and school  PERSONAL FACTORS: 1-2 comorbidities: previous R ankle fracture and chronic hamstring strains are also affecting patient's functional outcome.   REHAB POTENTIAL: Good  CLINICAL DECISION MAKING: Evolving/moderate complexity  EVALUATION COMPLEXITY: Moderate   GOALS: Goals reviewed with patient? Yes  SHORT TERM GOALS: Target date: 11/16/2024 Pt will be compliant and independent with HEP to assist with symptom management/recovery at home.  Baseline: [email or web address] Goal status: INITIAL  2.  Pt will demonstrate equal AROM B to assist with recreational activities.  Baseline:  Active ROM Right eval Left eval  Hip flexion 110 90*   Hip extension 10 5*  Hip abduction 40 25*  Hip adduction    Hip internal rotation WNL WNL*  Hip external rotation WNL WNL  Knee flexion    Knee extension 0 0  Ankle dorsiflexion 5 3  Ankle plantarflexion 50 40  Ankle inversion WNL WNL  Ankle eversion WNL WNL*   Goal status: INITIAL  3.  Pt will be able to run for 20 minutes without pain following.  Baseline: pain regardless of distance/length of time   Goal status: INITIAL  4.  Pt squat form will show no sign of L knee valgus or hip IR, bodyweight or loaded.  Baseline: L knee valgus and hip IR  Goal status: INITIAL  5.  Pt will report no pain at rest.  Baseline: 3/10  Goal status: INITIAL   LONG TERM GOALS: Target date: 12/14/2024  Pt will demonstrate equal strength B to assist with  recreational activities.  Baseline:  MMT Right eval Left eval  Hip flexion    Hip extension 5/5 3+/5*  Hip abduction 5/5 3+/5*  Hip adduction    Hip internal rotation    Hip external rotation    Knee flexion 5/5 4/5*  Knee extension    Ankle dorsiflexion 5/5 3+/5  Ankle plantarflexion    Ankle inversion 5/5 5/5  Ankle  eversion 5/5 5/5   Goal status: INITIAL  2.  Pt will be able to squat 205lb x 5 without pain in order to return to PLOF within sport.  Baseline: pain with 45lb bar Goal status: INITIAL  3.  Pt will be able to complete a 40 yard dash in 5.2s or less.  Baseline: unable to sprint  Goal status: INITIAL  4.  Pt will report less than 2/10 pain the day of and after activity.  Baseline: unable to perform activities  Goal status: INITIAL  5.  Pt will be comfortable with her final HEP in order to continue any symptom management at home and to avoid regression.   Baseline: [email or web address] Goal status: INITIAL   PLAN:  PT FREQUENCY: 2x/week  PT DURATION: 8 weeks  PLANNED INTERVENTIONS: 97110-Therapeutic exercises, 97530- Therapeutic activity, 97112- Neuromuscular re-education, 97535- Self Care, 02859- Manual therapy, G0283- Electrical stimulation (unattended), 20560 (1-2 muscles), 20561 (3+ muscles)- Dry Needling, Patient/Family education, Balance training, Taping, Joint mobilization, Joint manipulation, Spinal manipulation, Spinal mobilization, Cryotherapy, and Moist heat  PLAN FOR NEXT SESSION: HEP review, hamstring isometrics, hamstring eccentrics, ankle strengthening/stability, glute med strengthening, squat form, nutrition discussion, flexibility of LE's.   Marijo DELENA Berber, PT 10/19/2024, 10:23 AM

## 2024-10-21 NOTE — Addendum Note (Signed)
 Addended by: NYLE BARER A on: 10/21/2024 01:43 PM   Modules accepted: Orders

## 2024-10-30 ENCOUNTER — Ambulatory Visit

## 2024-11-02 ENCOUNTER — Ambulatory Visit

## 2024-11-02 DIAGNOSIS — M25552 Pain in left hip: Secondary | ICD-10-CM | POA: Insufficient documentation

## 2024-11-02 DIAGNOSIS — S76302D Unspecified injury of muscle, fascia and tendon of the posterior muscle group at thigh level, left thigh, subsequent encounter: Secondary | ICD-10-CM | POA: Diagnosis present

## 2024-11-02 NOTE — Therapy (Signed)
 OUTPATIENT PHYSICAL THERAPY LOWER EXTREMITY EVALUATION   Patient Name: Phillip Morrison MRN: [id number] DOB:01/12/2001, 23 y.o., male Today's Date: 11/02/2024  END OF SESSION:  PT End of Session - 11/02/24 0825     Visit Number 2    Number of Visits 16    Date for Recertification  12/14/24    Authorization Type [employer]    Authorization Time Period none until completed 27th visit    PT Start Time 0830    PT Stop Time 0915    PT Time Calculation (min) 45 min    Activity Tolerance Patient tolerated treatment well    Behavior During Therapy [facility] for tasks assessed/performed           Past Medical History:  Diagnosis Date   Acid reflux    Constipation    Exercise-induced asthma    prn inhaler   Family history of adverse reaction to anesthesia    pt's mother has hx. of post-op N/V   Shoulder dislocation 01/2018   left   Past Surgical History:  Procedure Laterality Date   SHOULDER ARTHROSCOPY WITH BANKART REPAIR Left 02/06/2018   Procedure: LEFT SHOULDER ARTHROSCOPY WITH LABRAL REPAIR;  Surgeon: Cristy Bonner DASEN, MD;  Location: [facility];  Service: Orthopedics;  Laterality: Left;   SHOULDER ARTHROSCOPY WITH CAPSULORRHAPHY Left 02/06/2018   Procedure: SHOULDER ATHROSCOPY WITH CAPSULORRHAPHY AND GLENOHUMERAL LIGAMENT REPAIR;  Surgeon: Cristy Bonner DASEN, MD;  Location: [facility];  Service: Orthopedics;  Laterality: Left;   Patient Active Problem List   Diagnosis Date Noted   Slow transit constipation 08/22/2015   Lactose intolerance 04/23/2012   Abdominal pain 01/31/2012    PCP: Tanda Bleacher, MD  REFERRING PROVIDER: Curtis Hadassah DASEN, MD  REFERRING DIAG: [phone] (ICD-10-CM) - Pain in left hip   THERAPY DIAG:  Pain in left hip  Left hamstring injury, subsequent encounter  Rationale for Evaluation and Treatment: Rehabilitation  ONSET DATE: 09/16/2024  SUBJECTIVE:   SUBJECTIVE STATEMENT: 11/02/2024 Pt reports no change in pain. He  tried jogging 2 days ago which caused anterior/lateral hip pain.   EVAL Pt reports he strained his L hamstring while cutting in football practice on 09/16/2024. He notes hearing a pop after pushing off the L leg. Pt fell and had trouble getting up. He went to the doctor after who performed an X-ray and MRI which were negative for a tear and fracture. Pt reports the MRI showed swelling around the upper portion of the hamstring. Notes trouble walking for a few days. He has been performing some exercises given to him by the AT but has not practiced. Pt did try to squat just the bar last week but there was too much pain. He was squatting 345lb for his 1RM. Pt expresses improvement in symptoms since onset but continues to have pain when standing, marching, going up stairs, running, and squatting.    PERTINENT HISTORY: L ankle fracture, previous L hamstring strains, low back pain, previous MCL injury (unsure on side) PAIN:  Are you having pain? Yes: NPRS scale: 3/10;  Pain location: proximal L hamstring Pain description: ache Aggravating factors: marching, squatting, stairs (going up), standing greater than 1 hour Relieving factors: rest  PRECAUTIONS: None  RED FLAGS: None   WEIGHT BEARING RESTRICTIONS: No  FALLS:  Has patient fallen in last 6 months? No  LIVING ENVIRONMENT: Lives with: lives with their family Lives in: House/apartment Stairs: Yes: Internal: 10 steps; did not ask Has following equipment at home: None  OCCUPATION: student  PLOF: Independent  PATIENT GOALS: return to sport   NEXT MD VISIT: tbd  OBJECTIVE:  Note: Objective measures were completed at Evaluation unless otherwise noted.  DIAGNOSTIC FINDINGS:  See subjective   PATIENT SURVEYS:  LEFS  Extreme difficulty/unable (0), Quite a bit of difficulty (1), Moderate difficulty (2), Little difficulty (3), No difficulty (4) Survey date:  10/19/2024  Any of your usual work, housework or school activities 3  2.  Usual hobbies, recreational or sporting activities 0  3. Getting into/out of the bath 3  4. Walking between rooms 4  5. Putting on socks/shoes 4  6. Squatting  1  7. Lifting an object, like a bag of groceries from the floor 1  8. Performing light activities around your home 4  9. Performing heavy activities around your home 2  10. Getting into/out of a car 4  11. Walking 2 blocks 2  12. Walking 1 mile 1  13. Going up/down 10 stairs (1 flight) 3  14. Standing for 1 hour 2  15.  sitting for 1 hour 4  16. Running on even ground 0  17. Running on uneven ground 0  18. Making sharp turns while running fast 0  19. Hopping  2  20. Rolling over in bed 4  Score total:  44/80     COGNITION: Overall cognitive status: Within functional limits for tasks assessed     SENSATION: WFL  MUSCLE LENGTH: Hamstrings: deferred Thomas test: deferred  POSTURE: anterior pelvic tilt  PALPATION: Tenderness over proximal hamstring on L   LOWER EXTREMITY ROM:  Active ROM Right eval Left eval  Hip flexion 110 90*   Hip extension 10 5*  Hip abduction 40 25*  Hip adduction    Hip internal rotation WNL WNL*  Hip external rotation WNL WNL  Knee flexion    Knee extension 0 0  Ankle dorsiflexion 5 3  Ankle plantarflexion 50 40  Ankle inversion WNL WNL  Ankle eversion WNL WNL*   (Blank rows = not tested)(* = pain)   LOWER EXTREMITY MMT:  MMT Right eval Left eval  Hip flexion    Hip extension 5/5 3+/5*  Hip abduction 5/5 3+/5*  Hip adduction    Hip internal rotation    Hip external rotation    Knee flexion 5/5 4/5*  Knee extension    Ankle dorsiflexion 5/5 3+/5  Ankle plantarflexion    Ankle inversion 5/5 5/5  Ankle eversion 5/5 5/5   (Blank rows = not tested)(* = pain)   FUNCTIONAL TESTS:  SLS - able on R; mod sway on L  Squat bodyweight - ankle pronation, knee valgus, hip IR, cannot get below 90; pain; all on L side Squat with 45lb bar - ankle pronation, knee valgus, hip IR,  cannot get below 90; pain; all on L side  TREATMENT DATE:  [facility] Adult PT Treatment:                                                DATE: 11/02/2024   Neuro Re-Ed Prone hamstring iso with ball 10x5 hold Supine knees extended bridge x 15  Supine hamstring foam roller curl eccentric 2x8 Supine hamstring foam roller curl x 10  Hip star x 5 B  Side plank with clam with RTB x 10 B  Hamstring stretch 3 way x 30 each  Heels elevated goblet squat 25# KB x 15 (partial range) Reverse lunge with green band at knee (valgus and varus forces) x 5 B each   [facility] Adult PT Treatment:                                                DATE: 10/14/2024    Therapeutic Exercise:  Squat bodyweight x 3 - form check + education  Squat with 45lb bar x 3 - form check + education   PATIENT EDUCATION:  Education details: POC, HEP, diagnosis, prognosis Person educated: Patient and Parent Education method: Explanation, Demonstration, Tactile cues, Verbal cues, and Handouts Education comprehension: verbalized understanding, returned demonstration, verbal cues required, tactile cues required, and needs further education  HOME EXERCISE PROGRAM: Access Code: [email or web address] URL: [email or web address] Date: 10/19/2024 Prepared by: Marijo Berber  Exercises - Ankle Dorsiflexion with Resistance  - 1 x daily - 7 x weekly - 3 sets - 10 reps - Ankle Inversion with Resistance  - 1 x daily - 7 x weekly - 3 sets - 10 reps - Ankle Eversion with Resistance  - 1 x daily - 7 x weekly - 3 sets - 10 reps - Side Stepping with Resistance at Feet  - 1 x daily - 7 x weekly - 3 sets - 10 reps - Side Plank with Clam and Resistance  - 1 x daily - 7 x weekly - 3 sets - 10 reps - Seated Hamstring Curl with Anchored Resistance  - 1 x daily - 7 x weekly - 3 sets - 10 reps  ASSESSMENT:  CLINICAL  IMPRESSION: 11/02/2024 Pt instructed through hamstring exercises but continued to cramp in the R hamstring. Cramping decreased after hamstring stretch. No pain reported with partial range squats with anterior resistance. Pt demonstrates poor hip and ankle stability with lunge (knee valgus, hip drop, excessive ankle pronation). He does continue to have anterior hip pain with hip flexion and lateral hip pain with SL lateral movements. He was instructed through hip distraction via a band.   EVAL Patient is a 23 y.o. male who was seen today for physical therapy evaluation and treatment for L proximal hamstring strain. Pt presents with pain during hip extension, hip ABD, knee flexion, and hip IR. He is weak throughout the hip/knee/ankle with functional weakness seen in squatting and SLS. The pt has previous injuries on the L LE that is impacting his hip mobility and strength. The pt will benefit from skilled physical therapy to return decrease pain and increase function.    OBJECTIVE IMPAIRMENTS: decreased activity tolerance, decreased balance, decreased coordination, decreased mobility, decreased ROM, decreased strength, hypomobility, impaired flexibility, improper body mechanics, and pain.   ACTIVITY LIMITATIONS: lifting, bending,  standing, squatting, and stairs  PARTICIPATION LIMITATIONS: community activity and school  PERSONAL FACTORS: 1-2 comorbidities: previous R ankle fracture and chronic hamstring strains are also affecting patient's functional outcome.   REHAB POTENTIAL: Good  CLINICAL DECISION MAKING: Evolving/moderate complexity  EVALUATION COMPLEXITY: Moderate   GOALS: Goals reviewed with patient? Yes  SHORT TERM GOALS: Target date: 11/16/2024 Pt will be compliant and independent with HEP to assist with symptom management/recovery at home.  Baseline: [email or web address] Goal status: INITIAL  2.  Pt will demonstrate equal AROM B to assist with recreational activities.  Baseline:  Active ROM  Right eval Left eval  Hip flexion 110 90*   Hip extension 10 5*  Hip abduction 40 25*  Hip adduction    Hip internal rotation WNL WNL*  Hip external rotation WNL WNL  Knee flexion    Knee extension 0 0  Ankle dorsiflexion 5 3  Ankle plantarflexion 50 40  Ankle inversion WNL WNL  Ankle eversion WNL WNL*   Goal status: INITIAL  3.  Pt will be able to run for 20 minutes without pain following.  Baseline: pain regardless of distance/length of time   Goal status: INITIAL  4.  Pt squat form will show no sign of L knee valgus or hip IR, bodyweight or loaded.  Baseline: L knee valgus and hip IR  Goal status: INITIAL  5.  Pt will report no pain at rest.  Baseline: 3/10  Goal status: INITIAL   LONG TERM GOALS: Target date: 12/14/2024  Pt will demonstrate equal strength B to assist with recreational activities.  Baseline:  MMT Right eval Left eval  Hip flexion    Hip extension 5/5 3+/5*  Hip abduction 5/5 3+/5*  Hip adduction    Hip internal rotation    Hip external rotation    Knee flexion 5/5 4/5*  Knee extension    Ankle dorsiflexion 5/5 3+/5  Ankle plantarflexion    Ankle inversion 5/5 5/5  Ankle eversion 5/5 5/5   Goal status: INITIAL  2.  Pt will be able to squat 205lb x 5 without pain in order to return to PLOF within sport.  Baseline: pain with 45lb bar Goal status: INITIAL  3.  Pt will be able to complete a 40 yard dash in 5.2s or less.  Baseline: unable to sprint  Goal status: INITIAL  4.  Pt will report less than 2/10 pain the day of and after activity.  Baseline: unable to perform activities  Goal status: INITIAL  5.  Pt will be comfortable with her final HEP in order to continue any symptom management at home and to avoid regression.   Baseline: [email or web address] Goal status: INITIAL   PLAN:  PT FREQUENCY: 2x/week  PT DURATION: 8 weeks  PLANNED INTERVENTIONS: 97110-Therapeutic exercises, 97530- Therapeutic activity, 97112- Neuromuscular  re-education, 97535- Self Care, 02859- Manual therapy, G0283- Electrical stimulation (unattended), 20560 (1-2 muscles), 20561 (3+ muscles)- Dry Needling, Patient/Family education, Balance training, Taping, Joint mobilization, Joint manipulation, Spinal manipulation, Spinal mobilization, Cryotherapy, and Moist heat  PLAN FOR NEXT SESSION: HEP review, hamstring isometrics, hamstring eccentrics, ankle strengthening/stability, glute med strengthening, squat form, nutrition discussion, flexibility of LE's.   Marijo DELENA Berber, PT 11/02/2024, 10:31 AM

## 2024-11-05 ENCOUNTER — Ambulatory Visit

## 2024-11-05 DIAGNOSIS — M25552 Pain in left hip: Secondary | ICD-10-CM

## 2024-11-05 DIAGNOSIS — S76302D Unspecified injury of muscle, fascia and tendon of the posterior muscle group at thigh level, left thigh, subsequent encounter: Secondary | ICD-10-CM

## 2024-11-05 NOTE — Therapy (Signed)
 OUTPATIENT PHYSICAL THERAPY LOWER EXTREMITY EVALUATION   Patient Name: Phillip Morrison MRN: [id number] DOB:[birth year], 23 y.o., male Today's Date: 11/05/2024  END OF SESSION:  PT End of Session - 11/05/24 0931     Visit Number 3    Number of Visits 16    Date for Recertification  12/14/24    Authorization Type [employer]    Authorization Time Period none until completed 27th visit    PT Start Time 0915    PT Stop Time 0955    PT Time Calculation (min) 40 min    Activity Tolerance Patient tolerated treatment well    Behavior During Therapy [facility] for tasks assessed/performed            Past Medical History:  Diagnosis Date   Acid reflux    Constipation    Exercise-induced asthma    prn inhaler   Family history of adverse reaction to anesthesia    pt's mother has hx. of post-op N/V   Shoulder dislocation 01/2018   left   Past Surgical History:  Procedure Laterality Date   SHOULDER ARTHROSCOPY WITH BANKART REPAIR Left 02/06/2018   Procedure: LEFT SHOULDER ARTHROSCOPY WITH LABRAL REPAIR;  Surgeon: Cristy Bonner DASEN, MD;  Location: [facility];  Service: Orthopedics;  Laterality: Left;   SHOULDER ARTHROSCOPY WITH CAPSULORRHAPHY Left 02/06/2018   Procedure: SHOULDER ATHROSCOPY WITH CAPSULORRHAPHY AND GLENOHUMERAL LIGAMENT REPAIR;  Surgeon: Cristy Bonner DASEN, MD;  Location:  SUR[facility]ERY C[facility];  Service: Orthopedics;  Laterality: Left;   Patient Active Problem List   Diagnosis Date Noted   Slow transit constipation 08/22/2015   Lactose intolerance 04/23/2012   Abdominal pain 01/31/2012    PCP: Tanda Bleacher, MD  REFERRING PROVIDER: Curtis Hadassah DASEN, MD  REFERRING DIAG: [phone] (ICD-10-CM) - Pain in left hip   THERAPY DIAG:  Pain in left hip  Left hamstring injury, subsequent encounter  Rationale for Evaluation and Treatment: Rehabilitation  ONSET DATE: 09/16/2024  SUBJECTIVE:   SUBJECTIVE STATEMENT: 11/05/2024 Patient reports no pain today.  He states that he has been able to do some jogging up to 5 minutes without pain. However, he has not tried pushing it further.   EVAL Pt reports he strained his L hamstring while cutting in football practice on 09/16/2024. He notes hearing a pop after pushing off the L leg. Pt fell and had trouble getting up. He went to the doctor after who performed an X-ray and MRI which were negative for a tear and fracture. Pt reports the MRI showed swelling around the upper portion of the hamstring. Notes trouble walking for a few days. He has been performing some exercises given to him by the AT but has not practiced. Pt did try to squat just the bar last week but there was too much pain. He was squatting 345lb for his 1RM. Pt expresses improvement in symptoms since onset but continues to have pain when standing, marching, going up stairs, running, and squatting.    PERTINENT HISTORY: L ankle fracture, previous L hamstring strains, low back pain, previous MCL injury (unsure on side) PAIN:  Are you having pain? Yes: NPRS scale: 3/10;  Pain location: proximal L hamstring Pain description: ache Aggravating factors: marching, squatting, stairs (going up), standing greater than 1 hour Relieving factors: rest  PRECAUTIONS: None  RED FLAGS: None   WEIGHT BEARING RESTRICTIONS: No  FALLS:  Has patient fallen in last 6 months? No  LIVING ENVIRONMENT: Lives with: lives with their family Lives in: House/apartment Stairs:  Yes: Internal: 10 steps; did not ask Has following equipment at home: None  OCCUPATION: student  PLOF: Independent  PATIENT GOALS: return to sport   NEXT MD VISIT: tbd  OBJECTIVE:  Note: Objective measures were completed at Evaluation unless otherwise noted.  DIAGNOSTIC FINDINGS:  See subjective   PATIENT SURVEYS:  LEFS  Extreme difficulty/unable (0), Quite a bit of difficulty (1), Moderate difficulty (2), Little difficulty (3), No difficulty (4) Survey date:  10/19/2024   Any of your usual work, housework or school activities 3  2. Usual hobbies, recreational or sporting activities 0  3. Getting into/out of the bath 3  4. Walking between rooms 4  5. Putting on socks/shoes 4  6. Squatting  1  7. Lifting an object, like a bag of groceries from the floor 1  8. Performing light activities around your home 4  9. Performing heavy activities around your home 2  10. Getting into/out of a car 4  11. Walking 2 blocks 2  12. Walking 1 mile 1  13. Going up/down 10 stairs (1 flight) 3  14. Standing for 1 hour 2  15.  sitting for 1 hour 4  16. Running on even ground 0  17. Running on uneven ground 0  18. Making sharp turns while running fast 0  19. Hopping  2  20. Rolling over in bed 4  Score total:  44/80     COGNITION: Overall cognitive status: Within functional limits for tasks assessed     SENSATION: WFL  MUSCLE LENGTH: Hamstrings: deferred Thomas test: deferred  POSTURE: anterior pelvic tilt  PALPATION: Tenderness over proximal hamstring on L   LOWER EXTREMITY ROM:  Active ROM Right eval Left eval  Hip flexion 110 90*   Hip extension 10 5*  Hip abduction 40 25*  Hip adduction    Hip internal rotation WNL WNL*  Hip external rotation WNL WNL  Knee flexion    Knee extension 0 0  Ankle dorsiflexion 5 3  Ankle plantarflexion 50 40  Ankle inversion WNL WNL  Ankle eversion WNL WNL*   (Blank rows = not tested)(* = pain)   LOWER EXTREMITY MMT:  MMT Right eval Left eval  Hip flexion    Hip extension 5/5 3+/5*  Hip abduction 5/5 3+/5*  Hip adduction    Hip internal rotation    Hip external rotation    Knee flexion 5/5 4/5*  Knee extension    Ankle dorsiflexion 5/5 3+/5  Ankle plantarflexion    Ankle inversion 5/5 5/5  Ankle eversion 5/5 5/5   (Blank rows = not tested)(* = pain)   FUNCTIONAL TESTS:  SLS - able on R; mod sway on L  Squat bodyweight - ankle pronation, knee valgus, hip IR, cannot get below 90; pain; all on L  side Squat with 45lb bar - ankle pronation, knee valgus, hip IR, cannot get below 90; pain; all on L side  TREATMENT DATE:   [facility] Adult PT Treatment:                                                DATE: 11/05/2024    Neuro Re-Ed Supine DKTC 2 x 10  Supine LTR 2 x 10  Supine thomas stretch 2 x 30 Supine HS stretch 2 x 30  Supine Hamstring iso with ball 10x5 hold Pball bridge x10 knee bent Pball bridge x10 knee extended to HS curl  Seated isometric HS curl into BOSU 2s8, 5s Reverse lunge with green band at knee (valgus and varus forces) x 8 each   [facility] Adult PT Treatment:                                                DATE: 11/02/2024   Neuro Re-Ed Prone hamstring iso with ball 10x5 hold Supine knees extended bridge x 15  Supine hamstring foam roller curl eccentric 2x8 Supine hamstring foam roller curl x 10  Hip star x 5 B  Side plank with clam with RTB x 10 B  Hamstring stretch 3 way x 30 each  Heels elevated goblet squat 25# KB x 15 (partial range) Reverse lunge with green band at knee (valgus and varus forces) x 5 B each   [facility] Adult PT Treatment:                                                DATE: 10/14/2024    Therapeutic Exercise:  Squat bodyweight x 3 - form check + education  Squat with 45lb bar x 3 - form check + education   PATIENT EDUCATION:  Education details: POC, HEP, diagnosis, prognosis Person educated: Patient and Parent Education method: Explanation, Demonstration, Tactile cues, Verbal cues, and Handouts Education comprehension: verbalized understanding, returned demonstration, verbal cues required, tactile cues required, and needs further education  HOME EXERCISE PROGRAM: Access Code: [email or web address] URL: [email or web address] Date: 10/19/2024 Prepared by: Marijo Berber  Exercises - Ankle Dorsiflexion with  Resistance  - 1 x daily - 7 x weekly - 3 sets - 10 reps - Ankle Inversion with Resistance  - 1 x daily - 7 x weekly - 3 sets - 10 reps - Ankle Eversion with Resistance  - 1 x daily - 7 x weekly - 3 sets - 10 reps - Side Stepping with Resistance at Feet  - 1 x daily - 7 x weekly - 3 sets - 10 reps - Side Plank with Clam and Resistance  - 1 x daily - 7 x weekly - 3 sets - 10 reps - Seated Hamstring Curl with Anchored Resistance  - 1 x daily - 7 x weekly - 3 sets - 10 reps  ASSESSMENT:  CLINICAL IMPRESSION: 11/05/2024 Patient was able to progress through today's session with only one instance of HS cramping with supine bridge and curl exercise. He is demonstrating improved control with reverse lunges, but appears to be limited by instability of L ankle. Plan to incorporate additional WB stabilization activities in order to deceased compensatory knee valgus. Encouraged patient to increase eccentric portion  of resisted ankle home exercises. We will otherwise continue to progress towards established rehab goals.   EVAL Patient is a 23 y.o. male who was seen today for physical therapy evaluation and treatment for L proximal hamstring strain. Pt presents with pain during hip extension, hip ABD, knee flexion, and hip IR. He is weak throughout the hip/knee/ankle with functional weakness seen in squatting and SLS. The pt has previous injuries on the L LE that is impacting his hip mobility and strength. The pt will benefit from skilled physical therapy to return decrease pain and increase function.    OBJECTIVE IMPAIRMENTS: decreased activity tolerance, decreased balance, decreased coordination, decreased mobility, decreased ROM, decreased strength, hypomobility, impaired flexibility, improper body mechanics, and pain.   ACTIVITY LIMITATIONS: lifting, bending, standing, squatting, and stairs  PARTICIPATION LIMITATIONS: community activity and school  PERSONAL FACTORS: 1-2 comorbidities: previous R ankle  fracture and chronic hamstring strains are also affecting patient's functional outcome.   REHAB POTENTIAL: Good  CLINICAL DECISION MAKING: Evolving/moderate complexity  EVALUATION COMPLEXITY: Moderate   GOALS: Goals reviewed with patient? Yes  SHORT TERM GOALS: Target date: 11/16/2024 Pt will be compliant and independent with HEP to assist with symptom management/recovery at home.  Baseline: [email or web address] Goal status: INITIAL  2.  Pt will demonstrate equal AROM B to assist with recreational activities.  Baseline:  Active ROM Right eval Left eval  Hip flexion 110 90*   Hip extension 10 5*  Hip abduction 40 25*  Hip adduction    Hip internal rotation WNL WNL*  Hip external rotation WNL WNL  Knee flexion    Knee extension 0 0  Ankle dorsiflexion 5 3  Ankle plantarflexion 50 40  Ankle inversion WNL WNL  Ankle eversion WNL WNL*   Goal status: INITIAL  3.  Pt will be able to run for 20 minutes without pain following.  Baseline: pain regardless of distance/length of time   Goal status: INITIAL  4.  Pt squat form will show no sign of L knee valgus or hip IR, bodyweight or loaded.  Baseline: L knee valgus and hip IR  Goal status: INITIAL  5.  Pt will report no pain at rest.  Baseline: 3/10  Goal status: INITIAL   LONG TERM GOALS: Target date: 12/14/2024  Pt will demonstrate equal strength B to assist with recreational activities.  Baseline:  MMT Right eval Left eval  Hip flexion    Hip extension 5/5 3+/5*  Hip abduction 5/5 3+/5*  Hip adduction    Hip internal rotation    Hip external rotation    Knee flexion 5/5 4/5*  Knee extension    Ankle dorsiflexion 5/5 3+/5  Ankle plantarflexion    Ankle inversion 5/5 5/5  Ankle eversion 5/5 5/5   Goal status: INITIAL  2.  Pt will be able to squat 205lb x 5 without pain in order to return to PLOF within sport.  Baseline: pain with 45lb bar Goal status: INITIAL  3.  Pt will be able to complete a 40 yard dash in 5.2s  or less.  Baseline: unable to sprint  Goal status: INITIAL  4.  Pt will report less than 2/10 pain the day of and after activity.  Baseline: unable to perform activities  Goal status: INITIAL  5.  Pt will be comfortable with her final HEP in order to continue any symptom management at home and to avoid regression.   Baseline: [email or web address] Goal status: INITIAL   PLAN:  PT FREQUENCY: 2x/week  PT  DURATION: 8 weeks  PLANNED INTERVENTIONS: 97110-Therapeutic exercises, 97530- Therapeutic activity, 97112- Neuromuscular re-education, [phone]- Self Care, [id number]- Manual therapy, [id number]- Electrical stimulation (unattended), 20560 (1-2 muscles), 20561 (3+ muscles)- Dry Needling, Patient/Family education, Balance training, Taping, Joint mobilization, Joint manipulation, Spinal manipulation, Spinal mobilization, Cryotherapy, and Moist heat  PLAN FOR NEXT SESSION: HEP review, hamstring isometrics, hamstring eccentrics, ankle strengthening/stability, glute med strengthening, squat form, nutrition discussion, flexibility of LE's.   Marko Molt, PT, DPT  11/05/2024 10:03 AM

## 2024-11-13 ENCOUNTER — Ambulatory Visit

## 2024-11-13 DIAGNOSIS — S76302D Unspecified injury of muscle, fascia and tendon of the posterior muscle group at thigh level, left thigh, subsequent encounter: Secondary | ICD-10-CM

## 2024-11-13 DIAGNOSIS — M25552 Pain in left hip: Secondary | ICD-10-CM | POA: Diagnosis not present

## 2024-11-13 NOTE — Therapy (Signed)
 " OUTPATIENT PHYSICAL THERAPY LOWER EXTREMITY EVALUATION   Patient Name: Phillip Morrison MRN: [id number] DOB:02/23/2001, 23 y.o., male Today's Date: 11/13/2024  END OF SESSION:  PT End of Session - 11/13/24 1051     Visit Number 4    Number of Visits 16    Date for Recertification  12/14/24    Authorization Type [employer]    Authorization Time Period none until completed 27th visit    PT Start Time 1052    PT Stop Time 1130    PT Time Calculation (min) 38 min    Activity Tolerance Patient tolerated treatment well    Behavior During Therapy WFL for tasks assessed/performed             Past Medical History:  Diagnosis Date   Acid reflux    Constipation    Exercise-induced asthma    prn inhaler   Family history of adverse reaction to anesthesia    pt's mother has hx. of post-op N/V   Shoulder dislocation 01/2018   left   Past Surgical History:  Procedure Laterality Date   SHOULDER ARTHROSCOPY WITH BANKART REPAIR Left 02/06/2018   Procedure: LEFT SHOULDER ARTHROSCOPY WITH LABRAL REPAIR;  Surgeon: Cristy Bonner DASEN, MD;  Location: [facility];  Service: Orthopedics;  Laterality: Left;   SHOULDER ARTHROSCOPY WITH CAPSULORRHAPHY Left 02/06/2018   Procedure: SHOULDER ATHROSCOPY WITH CAPSULORRHAPHY AND GLENOHUMERAL LIGAMENT REPAIR;  Surgeon: Cristy Bonner DASEN, MD;  Location: [facility];  Service: Orthopedics;  Laterality: Left;   Patient Active Problem List   Diagnosis Date Noted   Slow transit constipation 08/22/2015   Lactose intolerance 04/23/2012   Abdominal pain 01/31/2012    PCP: Tanda Bleacher, MD  REFERRING PROVIDER: Curtis Hadassah DASEN, MD  REFERRING DIAG: [phone] (ICD-10-CM) - Pain in left hip   THERAPY DIAG:  Pain in left hip  Left hamstring injury, subsequent encounter  Rationale for Evaluation and Treatment: Rehabilitation  ONSET DATE: 09/16/2024  SUBJECTIVE:   SUBJECTIVE STATEMENT: 11/13/2024 Patient reports no pain  today. He was able to play about 10 minutes of basketball the other day which did not bother his leg. He saw the ortho today who cleared him to run but no jumping or sprinting yet.   EVAL Pt reports he strained his L hamstring while cutting in football practice on 09/16/2024. He notes hearing a pop after pushing off the L leg. Pt fell and had trouble getting up. He went to the doctor after who performed an X-ray and MRI which were negative for a tear and fracture. Pt reports the MRI showed swelling around the upper portion of the hamstring. Notes trouble walking for a few days. He has been performing some exercises given to him by the AT but has not practiced. Pt did try to squat just the bar last week but there was too much pain. He was squatting 345lb for his 1RM. Pt expresses improvement in symptoms since onset but continues to have pain when standing, marching, going up stairs, running, and squatting.    PERTINENT HISTORY: L ankle fracture, previous L hamstring strains, low back pain, previous MCL injury (unsure on side) PAIN:  Are you having pain? Yes: NPRS scale: 3/10;  Pain location: proximal L hamstring Pain description: ache Aggravating factors: marching, squatting, stairs (going up), standing greater than 1 hour Relieving factors: rest  PRECAUTIONS: None  RED FLAGS: None   WEIGHT BEARING RESTRICTIONS: No  FALLS:  Has patient fallen in last 6 months? No  LIVING ENVIRONMENT: Lives with: lives with their family Lives in: House/apartment Stairs: Yes: Internal: 10 steps; did not ask Has following equipment at home: None  OCCUPATION: student  PLOF: Independent  PATIENT GOALS: return to sport   NEXT MD VISIT: tbd  OBJECTIVE:  Note: Objective measures were completed at Evaluation unless otherwise noted.  DIAGNOSTIC FINDINGS:  See subjective   PATIENT SURVEYS:  LEFS  Extreme difficulty/unable (0), Quite a bit of difficulty (1), Moderate difficulty (2), Little difficulty  (3), No difficulty (4) Survey date:  10/19/2024  Any of your usual work, housework or school activities 3  2. Usual hobbies, recreational or sporting activities 0  3. Getting into/out of the bath 3  4. Walking between rooms 4  5. Putting on socks/shoes 4  6. Squatting  1  7. Lifting an object, like a bag of groceries from the floor 1  8. Performing light activities around your home 4  9. Performing heavy activities around your home 2  10. Getting into/out of a car 4  11. Walking 2 blocks 2  12. Walking 1 mile 1  13. Going up/down 10 stairs (1 flight) 3  14. Standing for 1 hour 2  15.  sitting for 1 hour 4  16. Running on even ground 0  17. Running on uneven ground 0  18. Making sharp turns while running fast 0  19. Hopping  2  20. Rolling over in bed 4  Score total:  44/80     COGNITION: Overall cognitive status: Within functional limits for tasks assessed     SENSATION: WFL  MUSCLE LENGTH: Hamstrings: deferred Thomas test: deferred  POSTURE: anterior pelvic tilt  PALPATION: Tenderness over proximal hamstring on L   LOWER EXTREMITY ROM:  Active ROM Right eval Left eval  Hip flexion 110 90*   Hip extension 10 5*  Hip abduction 40 25*  Hip adduction    Hip internal rotation WNL WNL*  Hip external rotation WNL WNL  Knee flexion    Knee extension 0 0  Ankle dorsiflexion 5 3  Ankle plantarflexion 50 40  Ankle inversion WNL WNL  Ankle eversion WNL WNL*   (Blank rows = not tested)(* = pain)   LOWER EXTREMITY MMT:  MMT Right eval Left eval  Hip flexion    Hip extension 5/5 3+/5*  Hip abduction 5/5 3+/5*  Hip adduction    Hip internal rotation    Hip external rotation    Knee flexion 5/5 4/5*  Knee extension    Ankle dorsiflexion 5/5 3+/5  Ankle plantarflexion    Ankle inversion 5/5 5/5  Ankle eversion 5/5 5/5   (Blank rows = not tested)(* = pain)   FUNCTIONAL TESTS:  SLS - able on R; mod sway on L  Squat bodyweight - ankle pronation, knee  valgus, hip IR, cannot get below 90; pain; all on L side Squat with 45lb bar - ankle pronation, knee valgus, hip IR, cannot get below 90; pain; all on L side  TREATMENT DATE:   [facility] Adult PT Treatment:                                                DATE: 11/13/2024   Therapeutic Activity:  Jogging on treadmill @ x 5 mins  Hip hinge with dowel on back x 10  Hip hinge with 25# KB x 10  RDL with BB + 15# per side Power clean with BB + 40# per side  Education on form, power production, risk of injury  [facility] Adult PT Treatment:                                                DATE: 11/05/2024    Neuro Re-Ed Supine DKTC 2 x 10  Supine LTR 2 x 10  Supine thomas stretch 2 x 30 Supine HS stretch 2 x 30  Supine Hamstring iso with ball 10x5 hold Pball bridge x10 knee bent Pball bridge x10 knee extended to HS curl  Seated isometric HS curl into BOSU 2s8, 5s Reverse lunge with green band at knee (valgus and varus forces) x 8 each   [facility] Adult PT Treatment:                                                DATE: 11/02/2024   Neuro Re-Ed Prone hamstring iso with ball 10x5 hold Supine knees extended bridge x 15  Supine hamstring foam roller curl eccentric 2x8 Supine hamstring foam roller curl x 10  Hip star x 5 B  Side plank with clam with RTB x 10 B  Hamstring stretch 3 way x 30 each  Heels elevated goblet squat 25# KB x 15 (partial range) Reverse lunge with green band at knee (valgus and varus forces) x 5 B each   [facility] Adult PT Treatment:                                                DATE: 10/14/2024    Therapeutic Exercise:  Squat bodyweight x 3 - form check + education  Squat with 45lb bar x 3 - form check + education   PATIENT EDUCATION:  Education details: POC, HEP, diagnosis, prognosis Person educated: Patient and Parent Education method:  Explanation, Demonstration, Tactile cues, Verbal cues, and Handouts Education comprehension: verbalized understanding, returned demonstration, verbal cues required, tactile cues required, and needs further education  HOME EXERCISE PROGRAM: Access Code: [email or web address] URL: [email or web address] Date: 10/19/2024 Prepared by: Marijo Berber  Exercises - Ankle Dorsiflexion with Resistance  - 1 x daily - 7 x weekly - 3 sets - 10 reps - Ankle Inversion with Resistance  - 1 x daily - 7 x weekly - 3 sets - 10 reps - Ankle Eversion with Resistance  - 1 x daily - 7 x weekly - 3 sets - 10 reps - Side Stepping with Resistance at Feet  - 1 x daily - 7 x weekly - 3 sets - 10 reps -  Side Plank with Clam and Resistance  - 1 x daily - 7 x weekly - 3 sets - 10 reps - Seated Hamstring Curl with Anchored Resistance  - 1 x daily - 7 x weekly - 3 sets - 10 reps  ASSESSMENT:  CLINICAL IMPRESSION: 11/13/2024 Pt was able to jog for 5 minutes without pain. He demonstrates a slight L lower leg whip when jogging. Pt guided through hip hinging without pain regardless of load. Pt also demonstrated his power clean which was executed appropriately. He continues to require cueing to avoid excessive ankle pronation and knee valgus with squatting especially when he approaches 90 knee flexion. The pt will benefit from skilled physical therapy to decrease pain and increase function.    EVAL Patient is a 23 y.o. male who was seen today for physical therapy evaluation and treatment for L proximal hamstring strain. Pt presents with pain during hip extension, hip ABD, knee flexion, and hip IR. He is weak throughout the hip/knee/ankle with functional weakness seen in squatting and SLS. The pt has previous injuries on the L LE that is impacting his hip mobility and strength. The pt will benefit from skilled physical therapy to return decrease pain and increase function.    OBJECTIVE IMPAIRMENTS: decreased activity tolerance,  decreased balance, decreased coordination, decreased mobility, decreased ROM, decreased strength, hypomobility, impaired flexibility, improper body mechanics, and pain.   ACTIVITY LIMITATIONS: lifting, bending, standing, squatting, and stairs  PARTICIPATION LIMITATIONS: community activity and school  PERSONAL FACTORS: 1-2 comorbidities: previous R ankle fracture and chronic hamstring strains are also affecting patient's functional outcome.   REHAB POTENTIAL: Good  CLINICAL DECISION MAKING: Evolving/moderate complexity  EVALUATION COMPLEXITY: Moderate   GOALS: Goals reviewed with patient? Yes  SHORT TERM GOALS: Target date: 11/16/2024 Pt will be compliant and independent with HEP to assist with symptom management/recovery at home.  Baseline: [email or web address] Goal status: INITIAL  2.  Pt will demonstrate equal AROM B to assist with recreational activities.  Baseline:  Active ROM Right eval Left eval  Hip flexion 110 90*   Hip extension 10 5*  Hip abduction 40 25*  Hip adduction    Hip internal rotation WNL WNL*  Hip external rotation WNL WNL  Knee flexion    Knee extension 0 0  Ankle dorsiflexion 5 3  Ankle plantarflexion 50 40  Ankle inversion WNL WNL  Ankle eversion WNL WNL*   Goal status: INITIAL  3.  Pt will be able to run for 20 minutes without pain following.  Baseline: pain regardless of distance/length of time   Goal status: INITIAL  4.  Pt squat form will show no sign of L knee valgus or hip IR, bodyweight or loaded.  Baseline: L knee valgus and hip IR  Goal status: INITIAL  5.  Pt will report no pain at rest.  Baseline: 3/10  Goal status: INITIAL   LONG TERM GOALS: Target date: 12/14/2024  Pt will demonstrate equal strength B to assist with recreational activities.  Baseline:  MMT Right eval Left eval  Hip flexion    Hip extension 5/5 3+/5*  Hip abduction 5/5 3+/5*  Hip adduction    Hip internal rotation    Hip external rotation    Knee flexion 5/5  4/5*  Knee extension    Ankle dorsiflexion 5/5 3+/5  Ankle plantarflexion    Ankle inversion 5/5 5/5  Ankle eversion 5/5 5/5   Goal status: INITIAL  2.  Pt will be able to squat 205lb x 5  without pain in order to return to PLOF within sport.  Baseline: pain with 45lb bar Goal status: INITIAL  3.  Pt will be able to complete a 40 yard dash in 5.2s or less.  Baseline: unable to sprint  Goal status: INITIAL  4.  Pt will report less than 2/10 pain the day of and after activity.  Baseline: unable to perform activities  Goal status: INITIAL  5.  Pt will be comfortable with her final HEP in order to continue any symptom management at home and to avoid regression.   Baseline: [email or web address] Goal status: INITIAL   PLAN:  PT FREQUENCY: 2x/week  PT DURATION: 8 weeks  PLANNED INTERVENTIONS: 97110-Therapeutic exercises, 97530- Therapeutic activity, 97112- Neuromuscular re-education, 97535- Self Care, [id number]- Manual therapy, [id number]- Electrical stimulation (unattended), 20560 (1-2 muscles), 20561 (3+ muscles)- Dry Needling, Patient/Family education, Balance training, Taping, Joint mobilization, Joint manipulation, Spinal manipulation, Spinal mobilization, Cryotherapy, and Moist heat  PLAN FOR NEXT SESSION: HEP review, hamstring isometrics, hamstring eccentrics, ankle strengthening/stability, glute med strengthening, squat form, nutrition discussion, flexibility of LE's.   Marijo Berber PT, DPT 11/13/2024 11:53 AM  "

## 2024-11-16 ENCOUNTER — Ambulatory Visit

## 2024-11-16 DIAGNOSIS — M25552 Pain in left hip: Secondary | ICD-10-CM

## 2024-11-16 DIAGNOSIS — S76302D Unspecified injury of muscle, fascia and tendon of the posterior muscle group at thigh level, left thigh, subsequent encounter: Secondary | ICD-10-CM

## 2024-11-16 NOTE — Therapy (Signed)
 " OUTPATIENT PHYSICAL THERAPY NOTE   Patient Name: Patricio Popwell MRN: [id number] DOB:Jun 19, 2001, 23 y.o., male Today's Date: 11/16/2024  END OF SESSION:  PT End of Session - 11/16/24 0924     Visit Number 5    Number of Visits 16    Date for Recertification  12/14/24    Authorization Type [employer]    Authorization Time Period none until completed 27th visit    PT Start Time 0920    PT Stop Time 0958    PT Time Calculation (min) 38 min    Activity Tolerance Patient tolerated treatment well    Behavior During Therapy [facility] for tasks assessed/performed              Past Medical History:  Diagnosis Date   Acid reflux    Constipation    Exercise-induced asthma    prn inhaler   Family history of adverse reaction to anesthesia    pt's mother has hx. of post-op N/V   Shoulder dislocation 01/2018   left   Past Surgical History:  Procedure Laterality Date   SHOULDER ARTHROSCOPY WITH BANKART REPAIR Left 02/06/2018   Procedure: LEFT SHOULDER ARTHROSCOPY WITH LABRAL REPAIR;  Surgeon: Cristy Bonner DASEN, MD;  Location: [facility];  Service: Orthopedics;  Laterality: Left;   SHOULDER ARTHROSCOPY WITH CAPSULORRHAPHY Left 02/06/2018   Procedure: SHOULDER ATHROSCOPY WITH CAPSULORRHAPHY AND GLENOHUMERAL LIGAMENT REPAIR;  Surgeon: Cristy Bonner DASEN, MD;  Location: [facility];  Service: Orthopedics;  Laterality: Left;   Patient Active Problem List   Diagnosis Date Noted   Slow transit constipation 08/22/2015   Lactose intolerance 04/23/2012   Abdominal pain 01/31/2012    PCP: Tanda Bleacher, MD  REFERRING PROVIDER: Curtis Hadassah DASEN, MD  REFERRING DIAG: [phone] (ICD-10-CM) - Pain in left hip   THERAPY DIAG:  Pain in left hip  Left hamstring injury, subsequent encounter  Rationale for Evaluation and Treatment: Rehabilitation  ONSET DATE: 09/16/2024  SUBJECTIVE:   SUBJECTIVE STATEMENT: 11/16/2024  Patient reports no pain today. He was hasn't  been exercising much since last visit.   EVAL Pt reports he strained his L hamstring while cutting in football practice on 09/16/2024. He notes hearing a pop after pushing off the L leg. Pt fell and had trouble getting up. He went to the doctor after who performed an X-ray and MRI which were negative for a tear and fracture. Pt reports the MRI showed swelling around the upper portion of the hamstring. Notes trouble walking for a few days. He has been performing some exercises given to him by the AT but has not practiced. Pt did try to squat just the bar last week but there was too much pain. He was squatting 345lb for his 1RM. Pt expresses improvement in symptoms since onset but continues to have pain when standing, marching, going up stairs, running, and squatting.    PERTINENT HISTORY: L ankle fracture, previous L hamstring strains, low back pain, previous MCL injury (unsure on side) PAIN:  Are you having pain? Yes: NPRS scale: 3/10;  Pain location: proximal L hamstring Pain description: ache Aggravating factors: marching, squatting, stairs (going up), standing greater than 1 hour Relieving factors: rest  PRECAUTIONS: None  RED FLAGS: None   WEIGHT BEARING RESTRICTIONS: No  FALLS:  Has patient fallen in last 6 months? No  LIVING ENVIRONMENT: Lives with: lives with their family Lives in: House/apartment Stairs: Yes: Internal: 10 steps; did not ask Has following equipment at home: None  OCCUPATION: student  PLOF: Independent  PATIENT GOALS: return to sport   NEXT MD VISIT: tbd  OBJECTIVE:  Note: Objective measures were completed at Evaluation unless otherwise noted.  DIAGNOSTIC FINDINGS:  See subjective   PATIENT SURVEYS:  LEFS  Extreme difficulty/unable (0), Quite a bit of difficulty (1), Moderate difficulty (2), Little difficulty (3), No difficulty (4) Survey date:  10/19/2024  Any of your usual work, housework or school activities 3  2. Usual hobbies, recreational  or sporting activities 0  3. Getting into/out of the bath 3  4. Walking between rooms 4  5. Putting on socks/shoes 4  6. Squatting  1  7. Lifting an object, like a bag of groceries from the floor 1  8. Performing light activities around your home 4  9. Performing heavy activities around your home 2  10. Getting into/out of a car 4  11. Walking 2 blocks 2  12. Walking 1 mile 1  13. Going up/down 10 stairs (1 flight) 3  14. Standing for 1 hour 2  15.  sitting for 1 hour 4  16. Running on even ground 0  17. Running on uneven ground 0  18. Making sharp turns while running fast 0  19. Hopping  2  20. Rolling over in bed 4  Score total:  44/80     COGNITION: Overall cognitive status: Within functional limits for tasks assessed     SENSATION: WFL  MUSCLE LENGTH: Hamstrings: deferred Thomas test: deferred  POSTURE: anterior pelvic tilt  PALPATION: Tenderness over proximal hamstring on L   LOWER EXTREMITY ROM:  Active ROM Right eval Left eval  Hip flexion 110 90*   Hip extension 10 5*  Hip abduction 40 25*  Hip adduction    Hip internal rotation WNL WNL*  Hip external rotation WNL WNL  Knee flexion    Knee extension 0 0  Ankle dorsiflexion 5 3  Ankle plantarflexion 50 40  Ankle inversion WNL WNL  Ankle eversion WNL WNL*   (Blank rows = not tested)(* = pain)   LOWER EXTREMITY MMT:  MMT Right eval Left eval  Hip flexion    Hip extension 5/5 3+/5*  Hip abduction 5/5 3+/5*  Hip adduction    Hip internal rotation    Hip external rotation    Knee flexion 5/5 4/5*  Knee extension    Ankle dorsiflexion 5/5 3+/5  Ankle plantarflexion    Ankle inversion 5/5 5/5  Ankle eversion 5/5 5/5   (Blank rows = not tested)(* = pain)   FUNCTIONAL TESTS:  SLS - able on R; mod sway on L  Squat bodyweight - ankle pronation, knee valgus, hip IR, cannot get below 90; pain; all on L side Squat with 45lb bar - ankle pronation, knee valgus, hip IR, cannot get below 90; pain;  all on L side  TREATMENT DATE:   [facility] Adult PT Treatment:                                                DATE: 11/16/2024    excessive ankle pronation and knee valgus with squatting especially when he approaches 90 knee flexion.   Therapeutic Activity:  Treadmill  Walk at slow speed x 2 minutes Jog @4 . x 2 minutes  Treadmill side stepping 1.5 min each side, include 4, 0.8 mph Treadmill backwards walking, incline 4 @ 1.3 mph STS/chair taps with GTB to address knee valgus  Pball bridge x10 knee extended to HS curl 2s10 (or to fatigue), 5s SL Bridge 2 x 10  SL RDL with 25# KB, 3 x 5  Triple flexion to extension (SL march) on green foam pad 2 x 8 each     [facility] Adult PT Treatment:                                                DATE: 11/13/2024   Therapeutic Activity:  Jogging on treadmill @ x 5 mins  Hip hinge with dowel on back x 10  Hip hinge with 25# KB x 10  RDL with BB + 15# per side Power clean with BB + 40# per side  Education on form, power production, risk of injury  [facility] Adult PT Treatment:                                                DATE: 11/05/2024    Neuro Re-Ed Supine DKTC 2 x 10  Supine LTR 2 x 10  Supine thomas stretch 2 x 30 Supine HS stretch 2 x 30  Supine Hamstring iso with ball 10x5 hold Pball bridge x10 knee bent Pball bridge x10 knee extended to HS curl  Seated isometric HS curl into BOSU 2s8, 5s Reverse lunge with green band at knee (valgus and varus forces) x 8 each   [facility] Adult PT Treatment:                                                DATE: 11/02/2024   Neuro Re-Ed Prone hamstring iso with ball 10x5 hold Supine knees extended bridge x 15  Supine hamstring foam roller curl eccentric 2x8 Supine hamstring foam roller curl x 10  Hip star x 5 B  Side plank with clam with RTB x 10 B  Hamstring stretch 3 way  x 30 each  Heels elevated goblet squat 25# KB x 15 (partial range) Reverse lunge with green band at knee (valgus and varus forces) x 5 B each   [facility] Adult PT Treatment:                                                DATE: 10/14/2024  Therapeutic Exercise:  Squat bodyweight x 3 - form check + education  Squat with 45lb bar x 3 - form check + education   PATIENT EDUCATION:  Education details: POC, HEP, diagnosis, prognosis Person educated: Patient and Parent Education method: Explanation, Demonstration, Tactile cues, Verbal cues, and Handouts Education comprehension: verbalized understanding, returned demonstration, verbal cues required, tactile cues required, and needs further education  HOME EXERCISE PROGRAM: Access Code: [email or web address] URL: [email or web address] Date: 10/19/2024 Prepared by: Marijo Berber  Exercises - Ankle Dorsiflexion with Resistance  - 1 x daily - 7 x weekly - 3 sets - 10 reps - Ankle Inversion with Resistance  - 1 x daily - 7 x weekly - 3 sets - 10 reps - Ankle Eversion with Resistance  - 1 x daily - 7 x weekly - 3 sets - 10 reps - Side Stepping with Resistance at Feet  - 1 x daily - 7 x weekly - 3 sets - 10 reps - Side Plank with Clam and Resistance  - 1 x daily - 7 x weekly - 3 sets - 10 reps - Seated Hamstring Curl with Anchored Resistance  - 1 x daily - 7 x weekly - 3 sets - 10 reps  ASSESSMENT:  CLINICAL IMPRESSION: 11/16/2024 Patient continues to demonstrate ankle instability with running gait, and single leg activities. He otherwise continues to require some cueing for slowing down to improve LE mm control. We will continue to progress per current POC.   EVAL Patient is a 23 y.o. male who was seen today for physical therapy evaluation and treatment for L proximal hamstring strain. Pt presents with pain during hip extension, hip ABD, knee flexion, and hip IR. He is weak throughout the hip/knee/ankle with functional weakness seen in squatting  and SLS. The pt has previous injuries on the L LE that is impacting his hip mobility and strength. The pt will benefit from skilled physical therapy to return decrease pain and increase function.    OBJECTIVE IMPAIRMENTS: decreased activity tolerance, decreased balance, decreased coordination, decreased mobility, decreased ROM, decreased strength, hypomobility, impaired flexibility, improper body mechanics, and pain.   ACTIVITY LIMITATIONS: lifting, bending, standing, squatting, and stairs  PARTICIPATION LIMITATIONS: community activity and school  PERSONAL FACTORS: 1-2 comorbidities: previous R ankle fracture and chronic hamstring strains are also affecting patient's functional outcome.   REHAB POTENTIAL: Good  CLINICAL DECISION MAKING: Evolving/moderate complexity  EVALUATION COMPLEXITY: Moderate   GOALS: Goals reviewed with patient? Yes  SHORT TERM GOALS: Target date: 11/16/2024 Pt will be compliant and independent with HEP to assist with symptom management/recovery at home.  Baseline: [email or web address] Goal status: INITIAL  2.  Pt will demonstrate equal AROM B to assist with recreational activities.  Baseline:  Active ROM Right eval Left eval  Hip flexion 110 90*   Hip extension 10 5*  Hip abduction 40 25*  Hip adduction    Hip internal rotation WNL WNL*  Hip external rotation WNL WNL  Knee flexion    Knee extension 0 0  Ankle dorsiflexion 5 3  Ankle plantarflexion 50 40  Ankle inversion WNL WNL  Ankle eversion WNL WNL*   Goal status: INITIAL  3.  Pt will be able to run for 20 minutes without pain following.  Baseline: pain regardless of distance/length of time   Goal status: INITIAL  4.  Pt squat form will show no sign of L knee valgus or hip IR, bodyweight or loaded.  Baseline: L knee valgus and hip IR  Goal status: INITIAL  5.  Pt will report no pain at rest.  Baseline: 3/10  Goal status: INITIAL   LONG TERM GOALS: Target date: 12/14/2024  Pt will demonstrate  equal strength B to assist with recreational activities.  Baseline:  MMT Right eval Left eval  Hip flexion    Hip extension 5/5 3+/5*  Hip abduction 5/5 3+/5*  Hip adduction    Hip internal rotation    Hip external rotation    Knee flexion 5/5 4/5*  Knee extension    Ankle dorsiflexion 5/5 3+/5  Ankle plantarflexion    Ankle inversion 5/5 5/5  Ankle eversion 5/5 5/5   Goal status: INITIAL  2.  Pt will be able to squat 205lb x 5 without pain in order to return to PLOF within sport.  Baseline: pain with 45lb bar Goal status: INITIAL  3.  Pt will be able to complete a 40 yard dash in 5.2s or less.  Baseline: unable to sprint  Goal status: INITIAL  4.  Pt will report less than 2/10 pain the day of and after activity.  Baseline: unable to perform activities  Goal status: INITIAL  5.  Pt will be comfortable with her final HEP in order to continue any symptom management at home and to avoid regression.   Baseline: [email or web address] Goal status: INITIAL   PLAN:  PT FREQUENCY: 2x/week  PT DURATION: 8 weeks  PLANNED INTERVENTIONS: 97110-Therapeutic exercises, 97530- Therapeutic activity, 97112- Neuromuscular re-education, 97535- Self Care, [id number]- Manual therapy, [id number]- Electrical stimulation (unattended), 20560 (1-2 muscles), 20561 (3+ muscles)- Dry Needling, Patient/Family education, Balance training, Taping, Joint mobilization, Joint manipulation, Spinal manipulation, Spinal mobilization, Cryotherapy, and Moist heat  PLAN FOR NEXT SESSION: HEP review, hamstring isometrics, hamstring eccentrics, ankle strengthening/stability, glute med strengthening, squat form, nutrition discussion, flexibility of LE's.   Marko Molt, PT, DPT  11/16/2024 10:02 AM   "

## 2024-11-18 ENCOUNTER — Ambulatory Visit

## 2024-11-18 DIAGNOSIS — S76302D Unspecified injury of muscle, fascia and tendon of the posterior muscle group at thigh level, left thigh, subsequent encounter: Secondary | ICD-10-CM

## 2024-11-18 DIAGNOSIS — M25552 Pain in left hip: Secondary | ICD-10-CM

## 2024-11-18 NOTE — Therapy (Signed)
 " OUTPATIENT PHYSICAL THERAPY NOTE   Patient Name: Phillip Morrison MRN: [id number] DOB:2001-09-29, 23 y.o., male Today's Date: 11/18/2024  END OF SESSION:  PT End of Session - 11/18/24 0835     Visit Number 6    Number of Visits 16    Date for Recertification  12/14/24    Authorization Type [employer]    Authorization Time Period none until completed 27th visit    PT Start Time 0830    PT Stop Time 0910    PT Time Calculation (min) 40 min          Past Medical History:  Diagnosis Date   Acid reflux    Constipation    Exercise-induced asthma    prn inhaler   Family history of adverse reaction to anesthesia    pt's mother has hx. of post-op N/V   Shoulder dislocation 01/2018   left   Past Surgical History:  Procedure Laterality Date   SHOULDER ARTHROSCOPY WITH BANKART REPAIR Left 02/06/2018   Procedure: LEFT SHOULDER ARTHROSCOPY WITH LABRAL REPAIR;  Surgeon: Cristy Bonner DASEN, MD;  Location: [facility];  Service: Orthopedics;  Laterality: Left;   SHOULDER ARTHROSCOPY WITH CAPSULORRHAPHY Left 02/06/2018   Procedure: SHOULDER ATHROSCOPY WITH CAPSULORRHAPHY AND GLENOHUMERAL LIGAMENT REPAIR;  Surgeon: Cristy Bonner DASEN, MD;  Location: [facility];  Service: Orthopedics;  Laterality: Left;   Patient Active Problem List   Diagnosis Date Noted   Slow transit constipation 08/22/2015   Lactose intolerance 04/23/2012   Abdominal pain 01/31/2012    PCP: Tanda Bleacher, MD  REFERRING PROVIDER: Curtis Hadassah DASEN, MD  REFERRING DIAG: [phone] (ICD-10-CM) - Pain in left hip   THERAPY DIAG:  Pain in left hip  Left hamstring injury, subsequent encounter  Rationale for Evaluation and Treatment: Rehabilitation  ONSET DATE: 09/16/2024  SUBJECTIVE:   SUBJECTIVE STATEMENT: 11/18/2024  Patient reports no pain today. He played basketball yesterday but needed to sit after 2 games due to some hip pain.   EVAL Pt reports he strained his L hamstring while  cutting in football practice on 09/16/2024. He notes hearing a pop after pushing off the L leg. Pt fell and had trouble getting up. He went to the doctor after who performed an X-ray and MRI which were negative for a tear and fracture. Pt reports the MRI showed swelling around the upper portion of the hamstring. Notes trouble walking for a few days. He has been performing some exercises given to him by the AT but has not practiced. Pt did try to squat just the bar last week but there was too much pain. He was squatting 345lb for his 1RM. Pt expresses improvement in symptoms since onset but continues to have pain when standing, marching, going up stairs, running, and squatting.    PERTINENT HISTORY: L ankle fracture, previous L hamstring strains, low back pain, previous MCL injury (unsure on side) PAIN:  Are you having pain? Yes: NPRS scale: 3/10;  Pain location: proximal L hamstring Pain description: ache Aggravating factors: marching, squatting, stairs (going up), standing greater than 1 hour Relieving factors: rest  PRECAUTIONS: None  RED FLAGS: None   WEIGHT BEARING RESTRICTIONS: No  FALLS:  Has patient fallen in last 6 months? No  LIVING ENVIRONMENT: Lives with: lives with their family Lives in: House/apartment Stairs: Yes: Internal: 10 steps; did not ask Has following equipment at home: None  OCCUPATION: student  PLOF: Independent  PATIENT GOALS: return to sport   NEXT MD VISIT:  tbd  OBJECTIVE:  Note: Objective measures were completed at Evaluation unless otherwise noted.  DIAGNOSTIC FINDINGS:  See subjective   PATIENT SURVEYS:  LEFS  Extreme difficulty/unable (0), Quite a bit of difficulty (1), Moderate difficulty (2), Little difficulty (3), No difficulty (4) Survey date:  10/19/2024  Any of your usual work, housework or school activities 3  2. Usual hobbies, recreational or sporting activities 0  3. Getting into/out of the bath 3  4. Walking between rooms 4  5.  Putting on socks/shoes 4  6. Squatting  1  7. Lifting an object, like a bag of groceries from the floor 1  8. Performing light activities around your home 4  9. Performing heavy activities around your home 2  10. Getting into/out of a car 4  11. Walking 2 blocks 2  12. Walking 1 mile 1  13. Going up/down 10 stairs (1 flight) 3  14. Standing for 1 hour 2  15.  sitting for 1 hour 4  16. Running on even ground 0  17. Running on uneven ground 0  18. Making sharp turns while running fast 0  19. Hopping  2  20. Rolling over in bed 4  Score total:  44/80     COGNITION: Overall cognitive status: Within functional limits for tasks assessed     SENSATION: WFL  MUSCLE LENGTH: Hamstrings: deferred Thomas test: deferred  POSTURE: anterior pelvic tilt  PALPATION: Tenderness over proximal hamstring on L   LOWER EXTREMITY ROM:  Active ROM Right eval Left eval  Hip flexion 110 90*   Hip extension 10 5*  Hip abduction 40 25*  Hip adduction    Hip internal rotation WNL WNL*  Hip external rotation WNL WNL  Knee flexion    Knee extension 0 0  Ankle dorsiflexion 5 3  Ankle plantarflexion 50 40  Ankle inversion WNL WNL  Ankle eversion WNL WNL*   (Blank rows = not tested)(* = pain)   LOWER EXTREMITY MMT:  MMT Right eval Left eval  Hip flexion    Hip extension 5/5 3+/5*  Hip abduction 5/5 3+/5*  Hip adduction    Hip internal rotation    Hip external rotation    Knee flexion 5/5 4/5*  Knee extension    Ankle dorsiflexion 5/5 3+/5  Ankle plantarflexion    Ankle inversion 5/5 5/5  Ankle eversion 5/5 5/5   (Blank rows = not tested)(* = pain)   FUNCTIONAL TESTS:  SLS - able on R; mod sway on L  Squat bodyweight - ankle pronation, knee valgus, hip IR, cannot get below 90; pain; all on L side Squat with 45lb bar - ankle pronation, knee valgus, hip IR, cannot get below 90; pain; all on L side                                                                                                                                 TREATMENT  [facility] Treatment:                                                DATE: 11/18/2024   Therapeutic Activity:  Double leg hopping forward, sides, triple, diagonal x 5 mins Single leg hopping forward, sides, triple, diagonal x 5 mins Box jump downs double and single leg x 10 each B  Reverse lunge with UE support x 10 B Step down from 8 inch box x 10 B  Bodyweight squat x 10  Horizontal leg press plate 2 x 20 reps   [facility] Adult PT Treatment:                                                DATE: 11/16/2024    excessive ankle pronation and knee valgus with squatting especially when he approaches 90 knee flexion.   Therapeutic Activity:  Treadmill  Walk at slow speed x 2 minutes Jog @4 . x 2 minutes  Treadmill side stepping 1.5 min each side, include 4, 0.8 mph Treadmill backwards walking, incline 4 @ 1.3 mph STS/chair taps with GTB to address knee valgus  Pball bridge x10 knee extended to HS curl 2s10 (or to fatigue), 5s SL Bridge 2 x 10  SL RDL with 25# KB, 3 x 5  Triple flexion to extension (SL march) on green foam pad 2 x 8 each     [facility] Adult PT Treatment:                                                DATE: 11/13/2024   Therapeutic Activity:  Jogging on treadmill @ x 5 mins  Hip hinge with dowel on back x 10  Hip hinge with 25# KB x 10  RDL with BB + 15# per side Power clean with BB + 40# per side  Education on form, power production, risk of injury  [facility] Adult PT Treatment:                                                DATE: 11/05/2024    Neuro Re-Ed Supine DKTC 2 x 10  Supine LTR 2 x 10  Supine thomas stretch 2 x 30 Supine HS stretch 2 x 30  Supine Hamstring iso with ball 10x5 hold Pball bridge x10 knee bent Pball bridge x10 knee extended to HS curl  Seated isometric HS curl into BOSU 2s8, 5s Reverse lunge with green band at knee (valgus and varus forces) x 8 each   [facility] Adult PT  Treatment:                                                DATE: 11/02/2024   Neuro Re-Ed Prone hamstring iso with ball 10x5 hold Supine knees extended bridge x 15  Supine hamstring foam roller curl eccentric 2x8 Supine hamstring foam roller curl x 10  Hip star x 5 B  Side plank with clam with RTB x 10 B  Hamstring stretch 3 way x 30 each  Heels elevated goblet squat 25# KB x 15 (partial range) Reverse lunge with green band at knee (valgus and varus forces) x 5 B each   [facility] Adult PT Treatment:                                                DATE: 10/14/2024    Therapeutic Exercise:  Squat bodyweight x 3 - form check + education  Squat with 45lb bar x 3 - form check + education   PATIENT EDUCATION:  Education details: POC, HEP, diagnosis, prognosis Person educated: Patient and Parent Education method: Explanation, Demonstration, Tactile cues, Verbal cues, and Handouts Education comprehension: verbalized understanding, returned demonstration, verbal cues required, tactile cues required, and needs further education  HOME EXERCISE PROGRAM: Access Code: [email or web address] URL: [email or web address] Date: 10/19/2024 Prepared by: Marijo Berber  Exercises - Ankle Dorsiflexion with Resistance  - 1 x daily - 7 x weekly - 3 sets - 10 reps - Ankle Inversion with Resistance  - 1 x daily - 7 x weekly - 3 sets - 10 reps - Ankle Eversion with Resistance  - 1 x daily - 7 x weekly - 3 sets - 10 reps - Side Stepping with Resistance at Feet  - 1 x daily - 7 x weekly - 3 sets - 10 reps - Side Plank with Clam and Resistance  - 1 x daily - 7 x weekly - 3 sets - 10 reps - Seated Hamstring Curl with Anchored Resistance  - 1 x daily - 7 x weekly - 3 sets - 10 reps  ASSESSMENT:  CLINICAL IMPRESSION: 11/18/2024 Patient able to perform all jumping without pain. He was taught step downs to work on ankle and knee stability. Pt able to squat with better knee control by end of session.  We will continue  to progress per current POC.   EVAL Patient is a 23 y.o. male who was seen today for physical therapy evaluation and treatment for L proximal hamstring strain. Pt presents with pain during hip extension, hip ABD, knee flexion, and hip IR. He is weak throughout the hip/knee/ankle with functional weakness seen in squatting and SLS. The pt has previous injuries on the L LE that is impacting his hip mobility and strength. The pt will benefit from skilled physical therapy to return decrease pain and increase function.    OBJECTIVE IMPAIRMENTS: decreased activity tolerance, decreased balance, decreased coordination, decreased mobility, decreased ROM, decreased strength, hypomobility, impaired flexibility, improper body mechanics, and pain.   ACTIVITY LIMITATIONS: lifting, bending, standing, squatting, and stairs  PARTICIPATION LIMITATIONS: community activity and school  PERSONAL FACTORS: 1-2 comorbidities: previous R ankle fracture and chronic hamstring strains are also affecting patient's functional outcome.   REHAB POTENTIAL: Good  CLINICAL DECISION MAKING: Evolving/moderate complexity  EVALUATION COMPLEXITY: Moderate   GOALS: Goals reviewed with patient? Yes  SHORT TERM GOALS: Target date: 11/16/2024 Pt will be compliant and independent with HEP to assist with symptom management/recovery at home.  Baseline: [email or web address] Goal status: INITIAL  2.  Pt will demonstrate equal AROM B to assist with recreational activities.  Baseline:  Active ROM Right eval Left eval  Hip flexion 110 90*   Hip extension 10 5*  Hip abduction 40 25*  Hip adduction    Hip internal rotation WNL WNL*  Hip external rotation WNL WNL  Knee flexion    Knee extension 0 0  Ankle dorsiflexion 5 3  Ankle plantarflexion 50 40  Ankle inversion WNL WNL  Ankle eversion WNL WNL*   Goal status: INITIAL  3.  Pt will be able to run for 20 minutes without pain following.  Baseline: pain regardless of distance/length of  time   Goal status: INITIAL  4.  Pt squat form will show no sign of L knee valgus or hip IR, bodyweight or loaded.  Baseline: L knee valgus and hip IR  Goal status: INITIAL  5.  Pt will report no pain at rest.  Baseline: 3/10  Goal status: INITIAL   LONG TERM GOALS: Target date: 12/14/2024  Pt will demonstrate equal strength B to assist with recreational activities.  Baseline:  MMT Right eval Left eval  Hip flexion    Hip extension 5/5 3+/5*  Hip abduction 5/5 3+/5*  Hip adduction    Hip internal rotation    Hip external rotation    Knee flexion 5/5 4/5*  Knee extension    Ankle dorsiflexion 5/5 3+/5  Ankle plantarflexion    Ankle inversion 5/5 5/5  Ankle eversion 5/5 5/5   Goal status: INITIAL  2.  Pt will be able to squat 205lb x 5 without pain in order to return to PLOF within sport.  Baseline: pain with 45lb bar Goal status: INITIAL  3.  Pt will be able to complete a 40 yard dash in 5.2s or less.  Baseline: unable to sprint  Goal status: INITIAL  4.  Pt will report less than 2/10 pain the day of and after activity.  Baseline: unable to perform activities  Goal status: INITIAL  5.  Pt will be comfortable with her final HEP in order to continue any symptom management at home and to avoid regression.   Baseline: [email or web address] Goal status: INITIAL   PLAN:  PT FREQUENCY: 2x/week  PT DURATION: 8 weeks  PLANNED INTERVENTIONS: 97110-Therapeutic exercises, 97530- Therapeutic activity, 97112- Neuromuscular re-education, 97535- Self Care, [id number]- Manual therapy, [id number]- Electrical stimulation (unattended), 20560 (1-2 muscles), 20561 (3+ muscles)- Dry Needling, Patient/Family education, Balance training, Taping, Joint mobilization, Joint manipulation, Spinal manipulation, Spinal mobilization, Cryotherapy, and Moist heat  PLAN FOR NEXT SESSION: HEP review, hamstring isometrics, hamstring eccentrics, ankle strengthening/stability, glute med strengthening, squat form,  nutrition discussion, flexibility of LE's.   Marijo Berber PT, DPT 11/18/2024 8:36 AM   "

## 2024-11-24 ENCOUNTER — Telehealth: Payer: Self-pay

## 2024-12-01 ENCOUNTER — Ambulatory Visit: Attending: Family Medicine

## 2024-12-01 DIAGNOSIS — S76302D Unspecified injury of muscle, fascia and tendon of the posterior muscle group at thigh level, left thigh, subsequent encounter: Secondary | ICD-10-CM | POA: Insufficient documentation

## 2024-12-01 DIAGNOSIS — M25552 Pain in left hip: Secondary | ICD-10-CM | POA: Insufficient documentation

## 2024-12-01 NOTE — Therapy (Signed)
 " OUTPATIENT PHYSICAL THERAPY NOTE   Patient Name: Phillip Morrison MRN: [id number] DOB:2001/02/27, 24 y.o., male Today's Date: 12/01/2024  END OF SESSION:  PT End of Session - 12/01/24 1538     Visit Number 7    Number of Visits 16    Date for Recertification  12/14/24    Authorization Type [employer]    Authorization Time Period none until completed 27th visit    PT Start Time 1450    PT Stop Time 1535    PT Time Calculation (min) 45 min    Activity Tolerance Patient tolerated treatment well    Behavior During Therapy WFL for tasks assessed/performed           Past Medical History:  Diagnosis Date   Acid reflux    Constipation    Exercise-induced asthma    prn inhaler   Family history of adverse reaction to anesthesia    pt's mother has hx. of post-op N/V   Shoulder dislocation 01/2018   left   Past Surgical History:  Procedure Laterality Date   SHOULDER ARTHROSCOPY WITH BANKART REPAIR Left 02/06/2018   Procedure: LEFT SHOULDER ARTHROSCOPY WITH LABRAL REPAIR;  Surgeon: Cristy Bonner DASEN, MD;  Location: [facility];  Service: Orthopedics;  Laterality: Left;   SHOULDER ARTHROSCOPY WITH CAPSULORRHAPHY Left 02/06/2018   Procedure: SHOULDER ATHROSCOPY WITH CAPSULORRHAPHY AND GLENOHUMERAL LIGAMENT REPAIR;  Surgeon: Cristy Bonner DASEN, MD;  Location:  SURGERY CENTER;  Service: Orthopedics;  Laterality: Left;   Patient Active Problem List   Diagnosis Date Noted   Slow transit constipation 08/22/2015   Lactose intolerance 04/23/2012   Abdominal pain 01/31/2012    PCP: Tanda Bleacher, MD  REFERRING PROVIDER: Curtis Hadassah DASEN, MD  REFERRING DIAG: [phone] (ICD-10-CM) - Pain in left hip   THERAPY DIAG:  Pain in left hip  Left hamstring injury, subsequent encounter  Rationale for Evaluation and Treatment: Rehabilitation  ONSET DATE: 09/16/2024  SUBJECTIVE:   SUBJECTIVE STATEMENT: 12/01/2024  Patient reports being sore from a full body lift  yesterday. He squatted 225lb for 5x5 with good form and depth. Notes no pain. He did work on the jumping activities.   EVAL Pt reports he strained his L hamstring while cutting in football practice on 09/16/2024. He notes hearing a pop after pushing off the L leg. Pt fell and had trouble getting up. He went to the doctor after who performed an X-ray and MRI which were negative for a tear and fracture. Pt reports the MRI showed swelling around the upper portion of the hamstring. Notes trouble walking for a few days. He has been performing some exercises given to him by the AT but has not practiced. Pt did try to squat just the bar last week but there was too much pain. He was squatting 345lb for his 1RM. Pt expresses improvement in symptoms since onset but continues to have pain when standing, marching, going up stairs, running, and squatting.    PERTINENT HISTORY: L ankle fracture, previous L hamstring strains, low back pain, previous MCL injury (unsure on side) PAIN:  Are you having pain? Yes: NPRS scale: 3/10;  Pain location: proximal L hamstring Pain description: ache Aggravating factors: marching, squatting, stairs (going up), standing greater than 1 hour Relieving factors: rest  PRECAUTIONS: None  RED FLAGS: None   WEIGHT BEARING RESTRICTIONS: No  FALLS:  Has patient fallen in last 6 months? No  LIVING ENVIRONMENT: Lives with: lives with their family Lives in: House/apartment Stairs: Yes:  Internal: 10 steps; did not ask Has following equipment at home: None  OCCUPATION: student  PLOF: Independent  PATIENT GOALS: return to sport   NEXT MD VISIT: tbd  OBJECTIVE:  Note: Objective measures were completed at Evaluation unless otherwise noted.  DIAGNOSTIC FINDINGS:  See subjective   PATIENT SURVEYS:  LEFS  Extreme difficulty/unable (0), Quite a bit of difficulty (1), Moderate difficulty (2), Little difficulty (3), No difficulty (4) Survey date:  10/19/2024  Any of your  usual work, housework or school activities 3  2. Usual hobbies, recreational or sporting activities 0  3. Getting into/out of the bath 3  4. Walking between rooms 4  5. Putting on socks/shoes 4  6. Squatting  1  7. Lifting an object, like a bag of groceries from the floor 1  8. Performing light activities around your home 4  9. Performing heavy activities around your home 2  10. Getting into/out of a car 4  11. Walking 2 blocks 2  12. Walking 1 mile 1  13. Going up/down 10 stairs (1 flight) 3  14. Standing for 1 hour 2  15.  sitting for 1 hour 4  16. Running on even ground 0  17. Running on uneven ground 0  18. Making sharp turns while running fast 0  19. Hopping  2  20. Rolling over in bed 4  Score total:  44/80     COGNITION: Overall cognitive status: Within functional limits for tasks assessed     SENSATION: WFL  MUSCLE LENGTH: Hamstrings: deferred Thomas test: deferred  POSTURE: anterior pelvic tilt  PALPATION: Tenderness over proximal hamstring on L   LOWER EXTREMITY ROM:  Active ROM Right eval Left eval  Hip flexion 110 90*   Hip extension 10 5*  Hip abduction 40 25*  Hip adduction    Hip internal rotation WNL WNL*  Hip external rotation WNL WNL  Knee flexion    Knee extension 0 0  Ankle dorsiflexion 5 3  Ankle plantarflexion 50 40  Ankle inversion WNL WNL  Ankle eversion WNL WNL*   (Blank rows = not tested)(* = pain)   LOWER EXTREMITY MMT:  MMT Right eval Left eval  Hip flexion    Hip extension 5/5 3+/5*  Hip abduction 5/5 3+/5*  Hip adduction    Hip internal rotation    Hip external rotation    Knee flexion 5/5 4/5*  Knee extension    Ankle dorsiflexion 5/5 3+/5  Ankle plantarflexion    Ankle inversion 5/5 5/5  Ankle eversion 5/5 5/5   (Blank rows = not tested)(* = pain)   FUNCTIONAL TESTS:  SLS - able on R; mod sway on L  Squat bodyweight - ankle pronation, knee valgus, hip IR, cannot get below 90; pain; all on L side Squat  with 45lb bar - ankle pronation, knee valgus, hip IR, cannot get below 90; pain; all on L side  TREATMENT DATE:   [facility] Adult PT Treatment:                                                DATE: 12/01/2024   Therapeutic Exercise:  Windshield wipers x 30 90/90 stretch x 30 B  World's greatest stretch x 30 B  Shin box x 5 B   Neuro Re-Ed:  Curtsy lunge with landmine + 15lb plate x 10 B  SLS on foam with OH press using landmine + 15lb plate x 10 B    [facility] Adult PT Treatment:                                                DATE: 11/16/2024    excessive ankle pronation and knee valgus with squatting especially when he approaches 90 knee flexion.   Therapeutic Activity:  Treadmill  Walk at slow speed x 2 minutes Jog @4 . x 2 minutes  Treadmill side stepping 1.5 min each side, include 4, 0.8 mph Treadmill backwards walking, incline 4 @ 1.3 mph STS/chair taps with GTB to address knee valgus  Pball bridge x10 knee extended to HS curl 2s10 (or to fatigue), 5s SL Bridge 2 x 10  SL RDL with 25# KB, 3 x 5  Triple flexion to extension (SL march) on green foam pad 2 x 8 each     [facility] Adult PT Treatment:                                                DATE: 11/13/2024   Therapeutic Activity:  Jogging on treadmill @ x 5 mins  Hip hinge with dowel on back x 10  Hip hinge with 25# KB x 10  RDL with BB + 15# per side Power clean with BB + 40# per side  Education on form, power production, risk of injury  [facility] Adult PT Treatment:                                                DATE: 11/05/2024    Neuro Re-Ed Supine DKTC 2 x 10  Supine LTR 2 x 10  Supine thomas stretch 2 x 30 Supine HS stretch 2 x 30  Supine Hamstring iso with ball 10x5 hold Pball bridge x10 knee bent Pball bridge x10 knee extended to HS curl  Seated isometric HS curl into BOSU 2s8,  5s Reverse lunge with green band at knee (valgus and varus forces) x 8 each   [facility] Adult PT Treatment:                                                DATE: 11/02/2024   Neuro Re-Ed Prone hamstring iso with ball 10x5 hold Supine knees extended bridge x 15  Supine hamstring foam roller curl eccentric 2x8 Supine hamstring  foam roller curl x 10  Hip star x 5 B  Side plank with clam with RTB x 10 B  Hamstring stretch 3 way x 30 each  Heels elevated goblet squat 25# KB x 15 (partial range) Reverse lunge with green band at knee (valgus and varus forces) x 5 B each   [facility] Adult PT Treatment:                                                DATE: 10/14/2024    Therapeutic Exercise:  Squat bodyweight x 3 - form check + education  Squat with 45lb bar x 3 - form check + education   PATIENT EDUCATION:  Education details: POC, HEP, diagnosis, prognosis Person educated: Patient and Parent Education method: Explanation, Demonstration, Tactile cues, Verbal cues, and Handouts Education comprehension: verbalized understanding, returned demonstration, verbal cues required, tactile cues required, and needs further education  HOME EXERCISE PROGRAM: Access Code: [email or web address] URL: [email or web address] Date: 10/19/2024 Prepared by: Marijo Berber  Exercises - Ankle Dorsiflexion with Resistance  - 1 x daily - 7 x weekly - 3 sets - 10 reps - Ankle Inversion with Resistance  - 1 x daily - 7 x weekly - 3 sets - 10 reps - Ankle Eversion with Resistance  - 1 x daily - 7 x weekly - 3 sets - 10 reps - Side Stepping with Resistance at Feet  - 1 x daily - 7 x weekly - 3 sets - 10 reps - Side Plank with Clam and Resistance  - 1 x daily - 7 x weekly - 3 sets - 10 reps - Seated Hamstring Curl with Anchored Resistance  - 1 x daily - 7 x weekly - 3 sets - 10 reps  ASSESSMENT:  CLINICAL IMPRESSION: 12/01/2024 Patient demonstrating decreased hip IR on the R hip as seen with 90/90 stretch and shin box. He is  unable to get onto the shin due to this ROM limitation. He was challenged with SL stability with lunges and SL OH press. He continues to excessively pronate in SLS but is progressing well overall.  We will continue to progress per current POC.   EVAL Patient is a 24 y.o. male who was seen today for physical therapy evaluation and treatment for L proximal hamstring strain. Pt presents with pain during hip extension, hip ABD, knee flexion, and hip IR. He is weak throughout the hip/knee/ankle with functional weakness seen in squatting and SLS. The pt has previous injuries on the L LE that is impacting his hip mobility and strength. The pt will benefit from skilled physical therapy to return decrease pain and increase function.    OBJECTIVE IMPAIRMENTS: decreased activity tolerance, decreased balance, decreased coordination, decreased mobility, decreased ROM, decreased strength, hypomobility, impaired flexibility, improper body mechanics, and pain.   ACTIVITY LIMITATIONS: lifting, bending, standing, squatting, and stairs  PARTICIPATION LIMITATIONS: community activity and school  PERSONAL FACTORS: 1-2 comorbidities: previous R ankle fracture and chronic hamstring strains are also affecting patient's functional outcome.   REHAB POTENTIAL: Good  CLINICAL DECISION MAKING: Evolving/moderate complexity  EVALUATION COMPLEXITY: Moderate   GOALS: Goals reviewed with patient? Yes  SHORT TERM GOALS: Target date: 11/16/2024 Pt will be compliant and independent with HEP to assist with symptom management/recovery at home.  Baseline: [email or web address] Goal status: INITIAL  2.  Pt will demonstrate equal AROM B to assist  with recreational activities.  Baseline:  Active ROM Right eval Left eval  Hip flexion 110 90*   Hip extension 10 5*  Hip abduction 40 25*  Hip adduction    Hip internal rotation WNL WNL*  Hip external rotation WNL WNL  Knee flexion    Knee extension 0 0  Ankle dorsiflexion 5 3  Ankle  plantarflexion 50 40  Ankle inversion WNL WNL  Ankle eversion WNL WNL*   Goal status: INITIAL  3.  Pt will be able to run for 20 minutes without pain following.  Baseline: pain regardless of distance/length of time   Goal status: INITIAL  4.  Pt squat form will show no sign of L knee valgus or hip IR, bodyweight or loaded.  Baseline: L knee valgus and hip IR  Goal status: INITIAL  5.  Pt will report no pain at rest.  Baseline: 3/10  Goal status: INITIAL   LONG TERM GOALS: Target date: 12/14/2024  Pt will demonstrate equal strength B to assist with recreational activities.  Baseline:  MMT Right eval Left eval  Hip flexion    Hip extension 5/5 3+/5*  Hip abduction 5/5 3+/5*  Hip adduction    Hip internal rotation    Hip external rotation    Knee flexion 5/5 4/5*  Knee extension    Ankle dorsiflexion 5/5 3+/5  Ankle plantarflexion    Ankle inversion 5/5 5/5  Ankle eversion 5/5 5/5   Goal status: INITIAL  2.  Pt will be able to squat 205lb x 5 without pain in order to return to PLOF within sport.  Baseline: pain with 45lb bar Goal status: INITIAL  3.  Pt will be able to complete a 40 yard dash in 5.2s or less.  Baseline: unable to sprint  Goal status: INITIAL  4.  Pt will report less than 2/10 pain the day of and after activity.  Baseline: unable to perform activities  Goal status: INITIAL  5.  Pt will be comfortable with her final HEP in order to continue any symptom management at home and to avoid regression.   Baseline: [email or web address] Goal status: INITIAL   PLAN:  PT FREQUENCY: 2x/week  PT DURATION: 8 weeks  PLANNED INTERVENTIONS: 97110-Therapeutic exercises, 97530- Therapeutic activity, 97112- Neuromuscular re-education, 97535- Self Care, [id number]- Manual therapy, [id number]- Electrical stimulation (unattended), 20560 (1-2 muscles), 20561 (3+ muscles)- Dry Needling, Patient/Family education, Balance training, Taping, Joint mobilization, Joint manipulation, Spinal  manipulation, Spinal mobilization, Cryotherapy, and Moist heat  PLAN FOR NEXT SESSION: HEP review, hamstring isometrics, hamstring eccentrics, ankle strengthening/stability, glute med strengthening, squat form, nutrition discussion, flexibility of LE's.   Marijo Berber PT, DPT 12/01/2024 3:41 PM  "

## 2024-12-04 ENCOUNTER — Ambulatory Visit

## 2024-12-04 DIAGNOSIS — M25552 Pain in left hip: Secondary | ICD-10-CM

## 2024-12-04 DIAGNOSIS — S76302D Unspecified injury of muscle, fascia and tendon of the posterior muscle group at thigh level, left thigh, subsequent encounter: Secondary | ICD-10-CM

## 2024-12-04 NOTE — Therapy (Signed)
 " OUTPATIENT PHYSICAL THERAPY NOTE   Patient Name: Phillip Morrison MRN: [id number] DOB:[birth year], 24 y.o., male Today's Date: 12/04/2024  END OF SESSION:     Past Medical History:  Diagnosis Date   Acid reflux    Constipation    Exercise-induced asthma    prn inhaler   Family history of adverse reaction to anesthesia    pt's mother has hx. of post-op N/V   Shoulder dislocation 01/2018   left   Past Surgical History:  Procedure Laterality Date   SHOULDER ARTHROSCOPY WITH BANKART REPAIR Left 02/06/2018   Procedure: LEFT SHOULDER ARTHROSCOPY WITH LABRAL REPAIR;  Surgeon: Cristy Bonner DASEN, MD;  Location: [facility];  Service: Orthopedics;  Laterality: Left;   SHOULDER ARTHROSCOPY WITH CAPSULORRHAPHY Left 02/06/2018   Procedure: SHOULDER ATHROSCOPY WITH CAPSULORRHAPHY AND GLENOHUMERAL LIGAMENT REPAIR;  Surgeon: Cristy Bonner DASEN, MD;  Location: [facility];  Service: Orthopedics;  Laterality: Left;   Patient Active Problem List   Diagnosis Date Noted   Slow transit constipation 08/22/2015   Lactose intolerance 04/23/2012   Abdominal pain 01/31/2012    PCP: Tanda Bleacher, MD  REFERRING PROVIDER: Curtis Hadassah DASEN, MD  REFERRING DIAG: [phone] (ICD-10-CM) - Pain in left hip   THERAPY DIAG:  No diagnosis found.  Rationale for Evaluation and Treatment: Rehabilitation  ONSET DATE: 09/16/2024  SUBJECTIVE:   SUBJECTIVE STATEMENT: 12/04/2024  ***  EVAL Pt reports he strained his L hamstring while cutting in football practice on 09/16/2024. He notes hearing a pop after pushing off the L leg. Pt fell and had trouble getting up. He went to the doctor after who performed an X-ray and MRI which were negative for a tear and fracture. Pt reports the MRI showed swelling around the upper portion of the hamstring. Notes trouble walking for a few days. He has been performing some exercises given to him by the AT but has not practiced. Pt did try to squat just the  bar last week but there was too much pain. He was squatting 345lb for his 1RM. Pt expresses improvement in symptoms since onset but continues to have pain when standing, marching, going up stairs, running, and squatting.    PERTINENT HISTORY: L ankle fracture, previous L hamstring strains, low back pain, previous MCL injury (unsure on side) PAIN:  Are you having pain? Yes: NPRS scale: 3/10;  Pain location: proximal L hamstring Pain description: ache Aggravating factors: marching, squatting, stairs (going up), standing greater than 1 hour Relieving factors: rest  PRECAUTIONS: None  RED FLAGS: None   WEIGHT BEARING RESTRICTIONS: No  FALLS:  Has patient fallen in last 6 months? No  LIVING ENVIRONMENT: Lives with: lives with their family Lives in: House/apartment Stairs: Yes: Internal: 10 steps; did not ask Has following equipment at home: None  OCCUPATION: student  PLOF: Independent  PATIENT GOALS: return to sport   NEXT MD VISIT: tbd  OBJECTIVE:  Note: Objective measures were completed at Evaluation unless otherwise noted.  DIAGNOSTIC FINDINGS:  See subjective   PATIENT SURVEYS:  LEFS  Extreme difficulty/unable (0), Quite a bit of difficulty (1), Moderate difficulty (2), Little difficulty (3), No difficulty (4) Survey date:  10/19/2024  Any of your usual work, housework or school activities 3  2. Usual hobbies, recreational or sporting activities 0  3. Getting into/out of the bath 3  4. Walking between rooms 4  5. Putting on socks/shoes 4  6. Squatting  1  7. Lifting an object, like a bag of  groceries from the floor 1  8. Performing light activities around your home 4  9. Performing heavy activities around your home 2  10. Getting into/out of a car 4  11. Walking 2 blocks 2  12. Walking 1 mile 1  13. Going up/down 10 stairs (1 flight) 3  14. Standing for 1 hour 2  15.  sitting for 1 hour 4  16. Running on even ground 0  17. Running on uneven ground 0  18.  Making sharp turns while running fast 0  19. Hopping  2  20. Rolling over in bed 4  Score total:  44/80     COGNITION: Overall cognitive status: Within functional limits for tasks assessed     SENSATION: WFL  MUSCLE LENGTH: Hamstrings: deferred Thomas test: deferred  POSTURE: anterior pelvic tilt  PALPATION: Tenderness over proximal hamstring on L   LOWER EXTREMITY ROM:  Active ROM Right eval Left eval  Hip flexion 110 90*   Hip extension 10 5*  Hip abduction 40 25*  Hip adduction    Hip internal rotation WNL WNL*  Hip external rotation WNL WNL  Knee flexion    Knee extension 0 0  Ankle dorsiflexion 5 3  Ankle plantarflexion 50 40  Ankle inversion WNL WNL  Ankle eversion WNL WNL*   (Blank rows = not tested)(* = pain)   LOWER EXTREMITY MMT:  MMT Right eval Left eval  Hip flexion    Hip extension 5/5 3+/5*  Hip abduction 5/5 3+/5*  Hip adduction    Hip internal rotation    Hip external rotation    Knee flexion 5/5 4/5*  Knee extension    Ankle dorsiflexion 5/5 3+/5  Ankle plantarflexion    Ankle inversion 5/5 5/5  Ankle eversion 5/5 5/5   (Blank rows = not tested)(* = pain)   FUNCTIONAL TESTS:  SLS - able on R; mod sway on L  Squat bodyweight - ankle pronation, knee valgus, hip IR, cannot get below 90; pain; all on L side Squat with 45lb bar - ankle pronation, knee valgus, hip IR, cannot get below 90; pain; all on L side                                                                                                                                TREATMENT DATE:   [facility] Adult PT Treatment:                                                DATE: 12/04/2024    Therapeutic Exercise:  Pball DKTC  Pball LTR Thomas stretch with OP from clinician Hip Rainbow x8 each side  Reverse clamshell 2 x 10  Elliptical x 5 minutes at end of session   Neuro Re-Ed:  Curtsy lunge with slider 2 x  10  SLS on airex with Black TB chop on R x 15 SLS on airex with Blue  TB chop on R x 15 Cueing for slower gait speed Standing BAPS board PF/DF x 8 Inver/ever x 8  Circles x 8  [facility] Adult PT Treatment:                                                DATE: 12/01/2024   Therapeutic Exercise:  Windshield wipers x 30 90/90 stretch x 30 B  World's greatest stretch x 30 B  Shin box x 5 B   Neuro Re-Ed:  Curtsy lunge with landmine + 15lb plate x 10 B  SLS on foam with OH press using landmine + 15lb plate x 10 B    [facility] Adult PT Treatment:                                                DATE: 11/16/2024    excessive ankle pronation and knee valgus with squatting especially when he approaches 90 knee flexion.   Therapeutic Activity:  Treadmill  Walk at slow speed x 2 minutes Jog @4 . x 2 minutes  Treadmill side stepping 1.5 min each side, include 4, 0.8 mph Treadmill backwards walking, incline 4 @ 1.3 mph STS/chair taps with GTB to address knee valgus  Pball bridge x10 knee extended to HS curl 2s10 (or to fatigue), 5s SL Bridge 2 x 10  SL RDL with 25# KB, 3 x 5  Triple flexion to extension (SL march) on green foam pad 2 x 8 each     [facility] Adult PT Treatment:                                                DATE: 11/13/2024   Therapeutic Activity:  Jogging on treadmill @ x 5 mins  Hip hinge with dowel on back x 10  Hip hinge with 25# KB x 10  RDL with BB + 15# per side Power clean with BB + 40# per side  Education on form, power production, risk of injury  [facility] Adult PT Treatment:                                                DATE: 11/05/2024    Neuro Re-Ed Supine DKTC 2 x 10  Supine LTR 2 x 10  Supine thomas stretch 2 x 30 Supine HS stretch 2 x 30  Supine Hamstring iso with ball 10x5 hold Pball bridge x10 knee bent Pball bridge x10 knee extended to HS curl  Seated isometric HS curl into BOSU 2s8, 5s Reverse lunge with green band at knee (valgus and varus forces) x 8 each   [facility] Adult PT Treatment:  DATE: 11/02/2024   Neuro Re-Ed Prone hamstring iso with ball 10x5 hold Supine knees extended bridge x 15  Supine hamstring foam roller curl eccentric 2x8 Supine hamstring foam roller curl x 10  Hip star x 5 B  Side plank with clam with RTB x 10 B  Hamstring stretch 3 way x 30 each  Heels elevated goblet squat 25# KB x 15 (partial range) Reverse lunge with green band at knee (valgus and varus forces) x 5 B each   [facility] Adult PT Treatment:                                                DATE: 10/14/2024    Therapeutic Exercise:  Squat bodyweight x 3 - form check + education  Squat with 45lb bar x 3 - form check + education   PATIENT EDUCATION:  Education details: POC, HEP, diagnosis, prognosis Person educated: Patient and Parent Education method: Explanation, Demonstration, Tactile cues, Verbal cues, and Handouts Education comprehension: verbalized understanding, returned demonstration, verbal cues required, tactile cues required, and needs further education  HOME EXERCISE PROGRAM: Access Code: [email or web address] URL: [email or web address] Date: 10/19/2024 Prepared by: Marijo Berber  Exercises - Ankle Dorsiflexion with Resistance  - 1 x daily - 7 x weekly - 3 sets - 10 reps - Ankle Inversion with Resistance  - 1 x daily - 7 x weekly - 3 sets - 10 reps - Ankle Eversion with Resistance  - 1 x daily - 7 x weekly - 3 sets - 10 reps - Side Stepping with Resistance at Feet  - 1 x daily - 7 x weekly - 3 sets - 10 reps - Side Plank with Clam and Resistance  - 1 x daily - 7 x weekly - 3 sets - 10 reps - Seated Hamstring Curl with Anchored Resistance  - 1 x daily - 7 x weekly - 3 sets - 10 reps  ASSESSMENT:  CLINICAL IMPRESSION: 12/04/2024 ***  EVAL Patient is a 24 y.o. male who was seen today for physical therapy evaluation and treatment for L proximal hamstring strain. Pt presents with pain during hip extension, hip ABD, knee flexion, and hip IR. He is weak  throughout the hip/knee/ankle with functional weakness seen in squatting and SLS. The pt has previous injuries on the L LE that is impacting his hip mobility and strength. The pt will benefit from skilled physical therapy to return decrease pain and increase function.    OBJECTIVE IMPAIRMENTS: decreased activity tolerance, decreased balance, decreased coordination, decreased mobility, decreased ROM, decreased strength, hypomobility, impaired flexibility, improper body mechanics, and pain.   ACTIVITY LIMITATIONS: lifting, bending, standing, squatting, and stairs  PARTICIPATION LIMITATIONS: community activity and school  PERSONAL FACTORS: 1-2 comorbidities: previous R ankle fracture and chronic hamstring strains are also affecting patient's functional outcome.   REHAB POTENTIAL: Good  CLINICAL DECISION MAKING: Evolving/moderate complexity  EVALUATION COMPLEXITY: Moderate   GOALS: Goals reviewed with patient? Yes  SHORT TERM GOALS: Target date: 11/16/2024 Pt will be compliant and independent with HEP to assist with symptom management/recovery at home.  Baseline: [email or web address] Goal status: INITIAL  2.  Pt will demonstrate equal AROM B to assist with recreational activities.  Baseline:  Active ROM Right eval Left eval  Hip flexion 110 90*   Hip extension 10 5*  Hip abduction 40 25*  Hip adduction  Hip internal rotation WNL WNL*  Hip external rotation WNL WNL  Knee flexion    Knee extension 0 0  Ankle dorsiflexion 5 3  Ankle plantarflexion 50 40  Ankle inversion WNL WNL  Ankle eversion WNL WNL*   Goal status: INITIAL  3.  Pt will be able to run for 20 minutes without pain following.  Baseline: pain regardless of distance/length of time   Goal status: INITIAL  4.  Pt squat form will show no sign of L knee valgus or hip IR, bodyweight or loaded.  Baseline: L knee valgus and hip IR  Goal status: INITIAL  5.  Pt will report no pain at rest.  Baseline: 3/10  Goal status:  INITIAL   LONG TERM GOALS: Target date: 12/14/2024  Pt will demonstrate equal strength B to assist with recreational activities.  Baseline:  MMT Right eval Left eval  Hip flexion    Hip extension 5/5 3+/5*  Hip abduction 5/5 3+/5*  Hip adduction    Hip internal rotation    Hip external rotation    Knee flexion 5/5 4/5*  Knee extension    Ankle dorsiflexion 5/5 3+/5  Ankle plantarflexion    Ankle inversion 5/5 5/5  Ankle eversion 5/5 5/5   Goal status: INITIAL  2.  Pt will be able to squat 205lb x 5 without pain in order to return to PLOF within sport.  Baseline: pain with 45lb bar Goal status: INITIAL  3.  Pt will be able to complete a 40 yard dash in 5.2s or less.  Baseline: unable to sprint  Goal status: INITIAL  4.  Pt will report less than 2/10 pain the day of and after activity.  Baseline: unable to perform activities  Goal status: INITIAL  5.  Pt will be comfortable with her final HEP in order to continue any symptom management at home and to avoid regression.   Baseline: [email or web address] Goal status: INITIAL   PLAN:  PT FREQUENCY: 2x/week  PT DURATION: 8 weeks  PLANNED INTERVENTIONS: 97110-Therapeutic exercises, 97530- Therapeutic activity, 97112- Neuromuscular re-education, 97535- Self Care, [id number]- Manual therapy, [id number]- Electrical stimulation (unattended), 20560 (1-2 muscles), 20561 (3+ muscles)- Dry Needling, Patient/Family education, Balance training, Taping, Joint mobilization, Joint manipulation, Spinal manipulation, Spinal mobilization, Cryotherapy, and Moist heat  PLAN FOR NEXT SESSION: HEP review, hamstring isometrics, hamstring eccentrics, ankle strengthening/stability, glute med strengthening, squat form, nutrition discussion, flexibility of LE's.   Marijo Berber PT, DPT 12/04/2024 8:01 AM  "

## 2024-12-08 ENCOUNTER — Ambulatory Visit

## 2024-12-08 DIAGNOSIS — S76302D Unspecified injury of muscle, fascia and tendon of the posterior muscle group at thigh level, left thigh, subsequent encounter: Secondary | ICD-10-CM

## 2024-12-08 DIAGNOSIS — M25552 Pain in left hip: Secondary | ICD-10-CM | POA: Diagnosis not present

## 2024-12-08 NOTE — Therapy (Unsigned)
 " OUTPATIENT PHYSICAL THERAPY NOTE   Patient Name: Phillip Morrison MRN: [id number] DOB:[birth year], 24 y.o., male Today's Date: 12/08/2024  END OF SESSION:       Past Medical History:  Diagnosis Date   Acid reflux    Constipation    Exercise-induced asthma    prn inhaler   Family history of adverse reaction to anesthesia    pt's mother has hx. of post-op N/V   Shoulder dislocation 01/2018   left   Past Surgical History:  Procedure Laterality Date   SHOULDER ARTHROSCOPY WITH BANKART REPAIR Left 02/06/2018   Procedure: LEFT SHOULDER ARTHROSCOPY WITH LABRAL REPAIR;  Surgeon: Cristy Bonner DASEN, MD;  Location: [facility];  Service: Orthopedics;  Laterality: Left;   SHOULDER ARTHROSCOPY WITH CAPSULORRHAPHY Left 02/06/2018   Procedure: SHOULDER ATHROSCOPY WITH CAPSULORRHAPHY AND GLENOHUMERAL LIGAMENT REPAIR;  Surgeon: Cristy Bonner DASEN, MD;  Location: [facility];  Service: Orthopedics;  Laterality: Left;   Patient Active Problem List   Diagnosis Date Noted   Slow transit constipation 08/22/2015   Lactose intolerance 04/23/2012   Abdominal pain 01/31/2012    PCP: Tanda Bleacher, MD  REFERRING PROVIDER: Curtis Hadassah DASEN, MD  REFERRING DIAG: [phone] (ICD-10-CM) - Pain in left hip   THERAPY DIAG:  No diagnosis found.  Rationale for Evaluation and Treatment: Rehabilitation  ONSET DATE: 09/16/2024  SUBJECTIVE:   SUBJECTIVE STATEMENT: 12/08/2024  Patient reporting some L hip pain and mm soreness, pointing to anterior and lateral hip   EVAL Pt reports he strained his L hamstring while cutting in football practice on 09/16/2024. He notes hearing a pop after pushing off the L leg. Pt fell and had trouble getting up. He went to the doctor after who performed an X-ray and MRI which were negative for a tear and fracture. Pt reports the MRI showed swelling around the upper portion of the hamstring. Notes trouble walking for a few days. He has been performing  some exercises given to him by the AT but has not practiced. Pt did try to squat just the bar last week but there was too much pain. He was squatting 345lb for his 1RM. Pt expresses improvement in symptoms since onset but continues to have pain when standing, marching, going up stairs, running, and squatting.    PERTINENT HISTORY: L ankle fracture, previous L hamstring strains, low back pain, previous MCL injury (unsure on side) PAIN:  Are you having pain? Yes: NPRS scale: 3/10;  Pain location: proximal L hamstring Pain description: ache Aggravating factors: marching, squatting, stairs (going up), standing greater than 1 hour Relieving factors: rest  PRECAUTIONS: None  RED FLAGS: None   WEIGHT BEARING RESTRICTIONS: No  FALLS:  Has patient fallen in last 6 months? No  LIVING ENVIRONMENT: Lives with: lives with their family Lives in: House/apartment Stairs: Yes: Internal: 10 steps; did not ask Has following equipment at home: None  OCCUPATION: student  PLOF: Independent  PATIENT GOALS: return to sport   NEXT MD VISIT: tbd  OBJECTIVE:  Note: Objective measures were completed at Evaluation unless otherwise noted.  DIAGNOSTIC FINDINGS:  See subjective   PATIENT SURVEYS:  LEFS  Extreme difficulty/unable (0), Quite a bit of difficulty (1), Moderate difficulty (2), Little difficulty (3), No difficulty (4) Survey date:  10/19/2024  Any of your usual work, housework or school activities 3  2. Usual hobbies, recreational or sporting activities 0  3. Getting into/out of the bath 3  4. Walking between rooms 4  5. Putting  on socks/shoes 4  6. Squatting  1  7. Lifting an object, like a bag of groceries from the floor 1  8. Performing light activities around your home 4  9. Performing heavy activities around your home 2  10. Getting into/out of a car 4  11. Walking 2 blocks 2  12. Walking 1 mile 1  13. Going up/down 10 stairs (1 flight) 3  14. Standing for 1 hour 2  15.   sitting for 1 hour 4  16. Running on even ground 0  17. Running on uneven ground 0  18. Making sharp turns while running fast 0  19. Hopping  2  20. Rolling over in bed 4  Score total:  44/80     COGNITION: Overall cognitive status: Within functional limits for tasks assessed     SENSATION: WFL  MUSCLE LENGTH: Hamstrings: deferred Thomas test: deferred  POSTURE: anterior pelvic tilt  PALPATION: Tenderness over proximal hamstring on L   LOWER EXTREMITY ROM:  Active ROM Right eval Left eval  Hip flexion 110 90*   Hip extension 10 5*  Hip abduction 40 25*  Hip adduction    Hip internal rotation WNL WNL*  Hip external rotation WNL WNL  Knee flexion    Knee extension 0 0  Ankle dorsiflexion 5 3  Ankle plantarflexion 50 40  Ankle inversion WNL WNL  Ankle eversion WNL WNL*   (Blank rows = not tested)(* = pain)   LOWER EXTREMITY MMT:  MMT Right eval Left eval  Hip flexion    Hip extension 5/5 3+/5*  Hip abduction 5/5 3+/5*  Hip adduction    Hip internal rotation    Hip external rotation    Knee flexion 5/5 4/5*  Knee extension    Ankle dorsiflexion 5/5 3+/5  Ankle plantarflexion    Ankle inversion 5/5 5/5  Ankle eversion 5/5 5/5   (Blank rows = not tested)(* = pain)   FUNCTIONAL TESTS:  SLS - able on R; mod sway on L  Squat bodyweight - ankle pronation, knee valgus, hip IR, cannot get below 90; pain; all on L side Squat with 45lb bar - ankle pronation, knee valgus, hip IR, cannot get below 90; pain; all on L side                                                                                                                                TREATMENT DATE:   [facility] Adult PT Treatment:                                                DATE: 12/04/2024    Therapeutic Exercise:  Pball DKTC  Pball LTR Thomas stretch with OP from clinician S/L Hip Rainbow ROM x8 each side  Reverse clamshell 2 x 10  Elliptical x 5 minutes at end of session   Neuro Re-Ed:   Curtsy lunge with slider 2 x 10 at // bars SLS on airex with Black TB chop on R x 15 SLS on airex with Blue TB chop on R x 15 Cueing for slower gait speed Standing BAPS board at // bars PF/DF x 8 Inver/ever x 8  Circles x 8  [facility] Adult PT Treatment:                                                DATE: 12/01/2024   Therapeutic Exercise:  Windshield wipers x 30 90/90 stretch x 30 B  World's greatest stretch x 30 B  Shin box x 5 B   Neuro Re-Ed:  Curtsy lunge with landmine + 15lb plate x 10 B  SLS on foam with OH press using landmine + 15lb plate x 10 B    [facility] Adult PT Treatment:                                                DATE: 11/16/2024    excessive ankle pronation and knee valgus with squatting especially when he approaches 90 knee flexion.   Therapeutic Activity:  Treadmill  Walk at slow speed x 2 minutes Jog @4 . x 2 minutes  Treadmill side stepping 1.5 min each side, include 4, 0.8 mph Treadmill backwards walking, incline 4 @ 1.3 mph STS/chair taps with GTB to address knee valgus  Pball bridge x10 knee extended to HS curl 2s10 (or to fatigue), 5s SL Bridge 2 x 10  SL RDL with 25# KB, 3 x 5  Triple flexion to extension (SL march) on green foam pad 2 x 8 each     [facility] Adult PT Treatment:                                                DATE: 11/13/2024   Therapeutic Activity:  Jogging on treadmill @ x 5 mins  Hip hinge with dowel on back x 10  Hip hinge with 25# KB x 10  RDL with BB + 15# per side Power clean with BB + 40# per side  Education on form, power production, risk of injury  [facility] Adult PT Treatment:                                                DATE: 11/05/2024    Neuro Re-Ed Supine DKTC 2 x 10  Supine LTR 2 x 10  Supine thomas stretch 2 x 30 Supine HS stretch 2 x 30  Supine Hamstring iso with ball 10x5 hold Pball bridge x10 knee bent Pball bridge x10 knee extended to HS curl  Seated isometric HS curl into BOSU 2s8, 5s Reverse  lunge with green band at knee (valgus and varus forces) x 8 each   [facility] Adult PT Treatment:  DATE: 11/02/2024   Neuro Re-Ed Prone hamstring iso with ball 10x5 hold Supine knees extended bridge x 15  Supine hamstring foam roller curl eccentric 2x8 Supine hamstring foam roller curl x 10  Hip star x 5 B  Side plank with clam with RTB x 10 B  Hamstring stretch 3 way x 30 each  Heels elevated goblet squat 25# KB x 15 (partial range) Reverse lunge with green band at knee (valgus and varus forces) x 5 B each   [facility] Adult PT Treatment:                                                DATE: 10/14/2024    Therapeutic Exercise:  Squat bodyweight x 3 - form check + education  Squat with 45lb bar x 3 - form check + education   PATIENT EDUCATION:  Education details: POC, HEP, diagnosis, prognosis Person educated: Patient and Parent Education method: Explanation, Demonstration, Tactile cues, Verbal cues, and Handouts Education comprehension: verbalized understanding, returned demonstration, verbal cues required, tactile cues required, and needs further education  HOME EXERCISE PROGRAM: Access Code: [email or web address] URL: [email or web address] Date: 10/19/2024 Prepared by: Marijo Berber  Exercises - Ankle Dorsiflexion with Resistance  - 1 x daily - 7 x weekly - 3 sets - 10 reps - Ankle Inversion with Resistance  - 1 x daily - 7 x weekly - 3 sets - 10 reps - Ankle Eversion with Resistance  - 1 x daily - 7 x weekly - 3 sets - 10 reps - Side Stepping with Resistance at Feet  - 1 x daily - 7 x weekly - 3 sets - 10 reps - Side Plank with Clam and Resistance  - 1 x daily - 7 x weekly - 3 sets - 10 reps - Seated Hamstring Curl with Anchored Resistance  - 1 x daily - 7 x weekly - 3 sets - 10 reps  ASSESSMENT:  CLINICAL IMPRESSION: 12/08/2024 Extended time with hip mobility activities d/t hip pain and soreness at start of session. He tolerated SL  activities fairly well today, with improved control. He was most challenged by dynamic activities requiring SL stability and UE movement. We will continue to progress as appropriate in order to return to PLOF with decreased risk of injury.   EVAL Patient is a 24 y.o. male who was seen today for physical therapy evaluation and treatment for L proximal hamstring strain. Pt presents with pain during hip extension, hip ABD, knee flexion, and hip IR. He is weak throughout the hip/knee/ankle with functional weakness seen in squatting and SLS. The pt has previous injuries on the L LE that is impacting his hip mobility and strength. The pt will benefit from skilled physical therapy to return decrease pain and increase function.    OBJECTIVE IMPAIRMENTS: decreased activity tolerance, decreased balance, decreased coordination, decreased mobility, decreased ROM, decreased strength, hypomobility, impaired flexibility, improper body mechanics, and pain.   ACTIVITY LIMITATIONS: lifting, bending, standing, squatting, and stairs  PARTICIPATION LIMITATIONS: community activity and school  PERSONAL FACTORS: 1-2 comorbidities: previous R ankle fracture and chronic hamstring strains are also affecting patient's functional outcome.   REHAB POTENTIAL: Good  CLINICAL DECISION MAKING: Evolving/moderate complexity  EVALUATION COMPLEXITY: Moderate   GOALS: Goals reviewed with patient? Yes  SHORT TERM GOALS: Target date: 11/16/2024 Pt will be compliant and independent with HEP to assist  with symptom management/recovery at home.  Baseline: [email or web address] Goal status: INITIAL  2.  Pt will demonstrate equal AROM B to assist with recreational activities.  Baseline:  Active ROM Right eval Left eval  Hip flexion 110 90*   Hip extension 10 5*  Hip abduction 40 25*  Hip adduction    Hip internal rotation WNL WNL*  Hip external rotation WNL WNL  Knee flexion    Knee extension 0 0  Ankle dorsiflexion 5 3  Ankle  plantarflexion 50 40  Ankle inversion WNL WNL  Ankle eversion WNL WNL*   Goal status: INITIAL  3.  Pt will be able to run for 20 minutes without pain following.  Baseline: pain regardless of distance/length of time   Goal status: INITIAL  4.  Pt squat form will show no sign of L knee valgus or hip IR, bodyweight or loaded.  Baseline: L knee valgus and hip IR  Goal status: INITIAL  5.  Pt will report no pain at rest.  Baseline: 3/10  Goal status: INITIAL   LONG TERM GOALS: Target date: 12/14/2024  Pt will demonstrate equal strength B to assist with recreational activities.  Baseline:  MMT Right eval Left eval  Hip flexion    Hip extension 5/5 3+/5*  Hip abduction 5/5 3+/5*  Hip adduction    Hip internal rotation    Hip external rotation    Knee flexion 5/5 4/5*  Knee extension    Ankle dorsiflexion 5/5 3+/5  Ankle plantarflexion    Ankle inversion 5/5 5/5  Ankle eversion 5/5 5/5   Goal status: INITIAL  2.  Pt will be able to squat 205lb x 5 without pain in order to return to PLOF within sport.  Baseline: pain with 45lb bar Goal status: INITIAL  3.  Pt will be able to complete a 40 yard dash in 5.2s or less.  Baseline: unable to sprint  Goal status: INITIAL  4.  Pt will report less than 2/10 pain the day of and after activity.  Baseline: unable to perform activities  Goal status: INITIAL  5.  Pt will be comfortable with her final HEP in order to continue any symptom management at home and to avoid regression.   Baseline: [email or web address] Goal status: INITIAL   PLAN:  PT FREQUENCY: 2x/week  PT DURATION: 8 weeks  PLANNED INTERVENTIONS: 97110-Therapeutic exercises, 97530- Therapeutic activity, 97112- Neuromuscular re-education, 97535- Self Care, [id number]- Manual therapy, [id number]- Electrical stimulation (unattended), 20560 (1-2 muscles), 20561 (3+ muscles)- Dry Needling, Patient/Family education, Balance training, Taping, Joint mobilization, Joint manipulation, Spinal  manipulation, Spinal mobilization, Cryotherapy, and Moist heat  PLAN FOR NEXT SESSION: HEP review, hamstring isometrics, hamstring eccentrics, ankle strengthening/stability, glute med strengthening, squat form, nutrition discussion, flexibility of LE's.   Marijo Berber PT, DPT 12/08/2024 2:22 PM   "

## 2024-12-10 ENCOUNTER — Ambulatory Visit
